# Patient Record
Sex: Male | Born: 1995 | Race: Black or African American | Hispanic: No | Marital: Single | Smoking: Current every day smoker
Health system: Southern US, Community
[De-identification: ages and names within clinical notes are randomized; demographics above are authoritative.]

## PROBLEM LIST (undated history)

## (undated) DIAGNOSIS — S060XAA Concussion with loss of consciousness status unknown, initial encounter: Secondary | ICD-10-CM

## (undated) DIAGNOSIS — S060X9A Concussion with loss of consciousness of unspecified duration, initial encounter: Secondary | ICD-10-CM

---
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
## 2016-08-19 ENCOUNTER — Emergency Department (HOSPITAL_COMMUNITY)
Admission: EM | Admit: 2016-08-19 | Discharge: 2016-08-19 | Disposition: A | Discharge status: Home / Self Care | Payer: No Typology Code available for payment source | Attending: Emergency Medicine | Admitting: Emergency Medicine

## 2016-08-19 ENCOUNTER — Encounter (HOSPITAL_COMMUNITY): Payer: Self-pay

## 2016-08-19 DIAGNOSIS — F17200 Nicotine dependence, unspecified, uncomplicated: Secondary | ICD-10-CM | POA: Diagnosis not present

## 2016-08-19 DIAGNOSIS — Y999 Unspecified external cause status: Secondary | ICD-10-CM | POA: Insufficient documentation

## 2016-08-19 DIAGNOSIS — Y92410 Unspecified street and highway as the place of occurrence of the external cause: Secondary | ICD-10-CM | POA: Diagnosis not present

## 2016-08-19 DIAGNOSIS — Y939 Activity, unspecified: Secondary | ICD-10-CM | POA: Diagnosis not present

## 2016-08-19 DIAGNOSIS — S060X0A Concussion without loss of consciousness, initial encounter: Secondary | ICD-10-CM | POA: Diagnosis not present

## 2016-08-19 DIAGNOSIS — S0990XA Unspecified injury of head, initial encounter: Secondary | ICD-10-CM | POA: Diagnosis present

## 2016-08-19 MED ORDER — IBUPROFEN 200 MG PO TABS
600.0000 mg | ORAL_TABLET | Freq: Once | ORAL | Status: AC
  Administered 2016-08-19: 600 mg via ORAL
  Filled 2016-08-19: qty 3

## 2016-08-19 MED ORDER — CYCLOBENZAPRINE HCL 10 MG PO TABS: 10.0000 mg | ORAL_TABLET | Freq: Every evening | ORAL | 0 refills | Status: DC | PRN

## 2016-08-19 MED ORDER — MELOXICAM 15 MG PO TABS: 15.0000 mg | ORAL_TABLET | Freq: Every day | ORAL | 0 refills | Status: DC

## 2016-08-19 NOTE — Discharge Instructions (Signed)
You may have symptoms up to 2-3 weeks. Return if symptoms are worsening.
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
## 2017-03-25 ENCOUNTER — Emergency Department (HOSPITAL_COMMUNITY)
Admission: EM | Admit: 2017-03-25 | Discharge: 2017-03-25 | Disposition: A | Discharge status: Home / Self Care | Payer: No Typology Code available for payment source | Attending: Emergency Medicine | Admitting: Emergency Medicine

## 2017-03-25 ENCOUNTER — Encounter (HOSPITAL_COMMUNITY): Payer: Self-pay | Admitting: Emergency Medicine

## 2017-03-25 DIAGNOSIS — S161XXA Strain of muscle, fascia and tendon at neck level, initial encounter: Secondary | ICD-10-CM | POA: Diagnosis not present

## 2017-03-25 DIAGNOSIS — S199XXA Unspecified injury of neck, initial encounter: Secondary | ICD-10-CM | POA: Diagnosis present

## 2017-03-25 DIAGNOSIS — Y999 Unspecified external cause status: Secondary | ICD-10-CM | POA: Insufficient documentation

## 2017-03-25 DIAGNOSIS — Y92410 Unspecified street and highway as the place of occurrence of the external cause: Secondary | ICD-10-CM | POA: Diagnosis not present

## 2017-03-25 DIAGNOSIS — F17200 Nicotine dependence, unspecified, uncomplicated: Secondary | ICD-10-CM | POA: Insufficient documentation

## 2017-03-25 DIAGNOSIS — Z79899 Other long term (current) drug therapy: Secondary | ICD-10-CM | POA: Diagnosis not present

## 2017-03-25 DIAGNOSIS — S39012A Strain of muscle, fascia and tendon of lower back, initial encounter: Secondary | ICD-10-CM | POA: Diagnosis not present

## 2017-03-25 DIAGNOSIS — Y939 Activity, unspecified: Secondary | ICD-10-CM | POA: Insufficient documentation

## 2017-03-25 HISTORY — DX: Concussion with loss of consciousness of unspecified duration, initial encounter: S06.0X9A

## 2017-03-25 HISTORY — DX: Concussion with loss of consciousness status unknown, initial encounter: S06.0XAA

## 2017-03-25 MED ORDER — IBUPROFEN 800 MG PO TABS
800.0000 mg | ORAL_TABLET | Freq: Once | ORAL | Status: AC
  Administered 2017-03-25: 800 mg via ORAL
  Filled 2017-03-25: qty 1

## 2017-03-25 MED ORDER — CYCLOBENZAPRINE HCL 10 MG PO TABS: 10.0000 mg | ORAL_TABLET | Freq: Two times a day (BID) | ORAL | 0 refills | Status: DC | PRN

## 2017-03-25 MED ORDER — IBUPROFEN 800 MG PO TABS: 800.0000 mg | ORAL_TABLET | Freq: Three times a day (TID) | ORAL | 0 refills | Status: DC | PRN
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
## 2019-03-30 ENCOUNTER — Encounter (HOSPITAL_COMMUNITY): Payer: Self-pay | Admitting: Emergency Medicine

## 2019-03-30 ENCOUNTER — Emergency Department (HOSPITAL_COMMUNITY)
Admission: EM | Admit: 2019-03-30 | Discharge: 2019-03-30 | Disposition: A | Discharge status: Home / Self Care | Payer: Self-pay | Attending: Emergency Medicine | Admitting: Emergency Medicine

## 2019-03-30 ENCOUNTER — Emergency Department (HOSPITAL_COMMUNITY): Payer: Self-pay

## 2019-03-30 DIAGNOSIS — E860 Dehydration: Secondary | ICD-10-CM | POA: Insufficient documentation

## 2019-03-30 DIAGNOSIS — F17200 Nicotine dependence, unspecified, uncomplicated: Secondary | ICD-10-CM | POA: Insufficient documentation

## 2019-03-30 DIAGNOSIS — Y9289 Other specified places as the place of occurrence of the external cause: Secondary | ICD-10-CM | POA: Insufficient documentation

## 2019-03-30 DIAGNOSIS — Y999 Unspecified external cause status: Secondary | ICD-10-CM | POA: Insufficient documentation

## 2019-03-30 DIAGNOSIS — Y9389 Activity, other specified: Secondary | ICD-10-CM | POA: Insufficient documentation

## 2019-03-30 DIAGNOSIS — R51 Headache: Secondary | ICD-10-CM | POA: Insufficient documentation

## 2019-03-30 DIAGNOSIS — W19XXXA Unspecified fall, initial encounter: Secondary | ICD-10-CM | POA: Insufficient documentation

## 2019-03-30 DIAGNOSIS — R569 Unspecified convulsions: Secondary | ICD-10-CM | POA: Insufficient documentation

## 2019-03-30 DIAGNOSIS — S80212A Abrasion, left knee, initial encounter: Secondary | ICD-10-CM | POA: Insufficient documentation

## 2019-03-30 LAB — CBC WITH DIFFERENTIAL/PLATELET
Abs Immature Granulocytes: 0.05 10*3/uL (ref 0.00–0.07)
Basophils Absolute: 0.1 10*3/uL (ref 0.0–0.1)
Basophils Relative: 1 %
Eosinophils Absolute: 0.2 10*3/uL (ref 0.0–0.5)
Eosinophils Relative: 2 %
HCT: 38.8 % — ABNORMAL LOW (ref 39.0–52.0)
Hemoglobin: 12 g/dL — ABNORMAL LOW (ref 13.0–17.0)
Immature Granulocytes: 1 %
Lymphocytes Relative: 42 %
Lymphs Abs: 3.8 10*3/uL (ref 0.7–4.0)
MCH: 25.7 pg — ABNORMAL LOW (ref 26.0–34.0)
MCHC: 30.9 g/dL (ref 30.0–36.0)
MCV: 83.1 fL (ref 80.0–100.0)
Monocytes Absolute: 0.8 10*3/uL (ref 0.1–1.0)
Monocytes Relative: 8 %
Neutro Abs: 4.2 10*3/uL (ref 1.7–7.7)
Neutrophils Relative %: 46 %
Platelets: 277 10*3/uL (ref 150–400)
RBC: 4.67 MIL/uL (ref 4.22–5.81)
RDW: 15.2 % (ref 11.5–15.5)
WBC: 9 10*3/uL (ref 4.0–10.5)
nRBC: 0 % (ref 0.0–0.2)

## 2019-03-30 LAB — COMPREHENSIVE METABOLIC PANEL
ALT: 22 U/L (ref 0–44)
AST: 42 U/L — ABNORMAL HIGH (ref 15–41)
Albumin: 4.1 g/dL (ref 3.5–5.0)
Alkaline Phosphatase: 50 U/L (ref 38–126)
Anion gap: 19 — ABNORMAL HIGH (ref 5–15)
BUN: 13 mg/dL (ref 6–20)
CO2: 18 mmol/L — ABNORMAL LOW (ref 22–32)
Calcium: 9 mg/dL (ref 8.9–10.3)
Chloride: 100 mmol/L (ref 98–111)
Creatinine, Ser: 1.39 mg/dL — ABNORMAL HIGH (ref 0.61–1.24)
GFR calc Af Amer: >60 mL/min (ref >60)
GFR calc non Af Amer: >60 mL/min (ref >60)
Glucose, Bld: 83 mg/dL (ref 70–99)
Potassium: 4.2 mmol/L (ref 3.5–5.1)
Sodium: 137 mmol/L (ref 135–145)
Total Bilirubin: 1.1 mg/dL (ref 0.3–1.2)
Total Protein: 7.3 g/dL (ref 6.5–8.1)

## 2019-03-30 MED ORDER — LACTATED RINGERS IV BOLUS
1000.0000 mL | Freq: Once | INTRAVENOUS | Status: AC
  Administered 2019-03-30: 1000 mL via INTRAVENOUS
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
## 2019-03-30 NOTE — ED Notes (Signed)
Pt ambulated in hallway with no difficulties, denies any dizziness

## 2019-03-30 NOTE — ED Provider Notes (Signed)
Emergency Department Provider Note   I have reviewed the triage vital signs and the nursing notes.   HISTORY  Chief Complaint Seizures   HPI Dakota [name] is a 23 y.o. male without any known past medical history who presents to the emergency department today with seizure-like activity.  History is obtained from the patient and from his brother who witnessed it.  Patient states that over the last couple days have been celebrate multiple birthdays is a decreased sleep decreased p.o. intake but has been increased alcohol intake.  Patient states that last night he remember was standing at the bar and then he remembers when paramedics were bringing him to the hospital and he has a slight headache at this time.  His brother states that everything was normal and they were just standing there and then the patient collapsed and had onset of full body shaking.  Did not notice any foaming the mouth.  Patient states that his tongue hurts and his left knee has an abrasion but no other pain.  Brother states that the shaking lasted approximate 4 to 5 minutes.  Noticed no incontinence.  Does not think that he hit his head on the way down.  Patient had some confusion afterwards that slowly improved and now patient feels like he is at baseline.   No other associated or modifying symptoms.    Past Medical History:  Diagnosis Date  . Concussion     Patient Active Problem List   Diagnosis Date Noted  . Altered mental status 01/02/2013    History reviewed. No pertinent surgical history.  Current Outpatient Rx  . Order #: [id number] Class: Print  . Order #: [id number] Class: Print    Allergies Patient has no known allergies.  Family History  Problem Relation Age of Onset  . Heart disease Maternal Grandmother   . Cancer Maternal Grandmother   . Kidney disease Maternal Grandfather   . Cancer Paternal Grandmother     Social History Social History   Tobacco Use  . Smoking status: Current Some  Day Smoker  . Smokeless tobacco: Never Used  Substance Use Topics  . Alcohol use: No  . Drug use: No    Review of Systems  All other systems negative except as documented in the HPI. All pertinent positives and negatives as reviewed in the HPI. ____________________________________________   PHYSICAL EXAM:  VITAL SIGNS: ED Triage Vitals  Enc Vitals Group     BP 03/30/19 0123 122/81     Pulse Rate 03/30/19 0123 73     Resp 03/30/19 0123 16     Temp 03/30/19 0123 (!) 97.5 F (36.4 C)     Temp Source 03/30/19 0123 Oral     SpO2 03/30/19 0123 100 %     Weight 03/30/19 0113 165 lb (74.8 kg)     Height 03/30/19 0113 6' (1.829 m)    Constitutional: Alert and oriented. Well appearing and in no acute distress. Eyes: Conjunctivae are normal. PERRL. EOMI. Mild strabismus Head: Atraumatic. Nose: No congestion/rhinnorhea. Mouth/Throat: Mucous membranes are moist.  Oropharynx non-erythematous.  Has bite mark on right lateral tongue Neck: No stridor.  No meningeal signs.   Cardiovascular: Normal rate, regular rhythm. Good peripheral circulation. Grossly normal heart sounds.   Respiratory: Normal respiratory effort.  No retractions. Lungs CTAB. Gastrointestinal: Soft and nontender. No distention.  Musculoskeletal: No lower extremity tenderness nor edema. No gross deformities of extremities. Neurologic:  No altered mental status, able to give full seemingly accurate history.  Face  is symmetric, EOM's intact, pupils equal and reactive, vision intact, tongue and uvula midline without deviation. Upper and Lower extremity motor 5/5, intact pain perception in distal extremities, 2+ reflexes in biceps, patella and achilles tendons. Able to perform finger to nose normal with both hands. Walks without assistance or evident ataxia.  Skin:  Skin is warm, dry does have an abrasion over his left knee is hemostatic without laceration.. No rash noted.  ____________________________________________    LABS (all labs ordered are listed, but only abnormal results are displayed)  Labs Reviewed  CBC WITH DIFFERENTIAL/PLATELET - Abnormal; Notable for the following components:      Result Value   Hemoglobin 12.0 (*)    HCT 38.8 (*)    MCH 25.7 (*)    All other components within normal limits  COMPREHENSIVE METABOLIC PANEL - Abnormal; Notable for the following components:   CO2 18 (*)    Creatinine, Ser 1.39 (*)    AST 42 (*)    Anion gap 19 (*)    All other components within normal limits   ____________________________________________  EKG   EKG Interpretation  Date/Time:  Thursday March 30 2019 01:22:15 EDT Ventricular Rate:  72 PR Interval:    QRS Duration: 126 QT Interval:  427 QTC Calculation: 468 R Axis:   54 Text Interpretation:  Sinus rhythm Nonspecific intraventricular conduction delay ST elev, probable normal early repol pattern given changes with age, no acute abnormalities compared to 2014. Confirmed by Marily MemosMesner, Destynie Toomey [id number]) on 03/30/2019 1:38:33 AM       ____________________________________________  RADIOLOGY  Ct Head Wo Contrast  Result Date: 03/30/2019 CLINICAL DATA:  Seizure, head trauma EXAM: CT HEAD WITHOUT CONTRAST TECHNIQUE: Contiguous axial images were obtained from the base of the skull through the vertex without intravenous contrast. COMPARISON:  01/01/2013 FINDINGS: Brain: No acute intracranial abnormality. Specifically, no hemorrhage, hydrocephalus, mass lesion, acute infarction, or significant intracranial injury. Vascular: No hyperdense vessel or unexpected calcification. Skull: No acute calvarial abnormality. Sinuses/Orbits: Visualized paranasal sinuses and mastoids clear. Orbital soft tissues unremarkable. Other: None IMPRESSION: Normal study. Electronically Signed   By: Charlett NoseKevin  Dover M.D.   On: 03/30/2019 02:46    ____________________________________________   PROCEDURES  Procedure(s) performed:   Procedures    ____________________________________________   INITIAL IMPRESSION / ASSESSMENT AND PLAN / ED COURSE    New seizure probably related to decreased sleep and decreased intake and increased alcohol intake.  I doubt that it would be a syncope with 4 to 5 minutes of full body shaking and postictal state with tongue biting but is obviously consideration.  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  Is very mild in nature but will check a head CT just to ensure there is no intracranial abnormalities there.  Secondary to binge drinking and decreased intake of last couple days we will check labs to make sure his sodium is fine.  EKG looks okay.  He is at his baseline so will likely be discharged if everything comes back normal. No indication for anti-epileptics currently   Pertinent labs & imaging results that were available during my care of the patient were reviewed by me and considered in my medical decision making (see chart for details).  A medical screening exam was performed and I feel the patient has had an appropriate workup for their chief complaint at this time and likelihood of emergent condition existing is low. They have been counseled on decision, discharge,  follow up and which symptoms necessitate immediate return to the emergency department. They or their family verbally stated understanding and agreement with plan and discharged in stable condition.   ____________________________________________  FINAL CLINICAL IMPRESSION(S) / ED DIAGNOSES  Final diagnoses:  Seizure-like activity (HCC)  Dehydration     MEDICATIONS GIVEN DURING THIS VISIT:  Medications  lactated ringers bolus 1,000 mL (0 mLs Intravenous Stopped 03/30/19 0406)     NEW OUTPATIENT MEDICATIONS STARTED DURING THIS VISIT:  Discharge Medication List as of 03/30/2019  4:10 AM      Note:  This note was prepared with assistance of Dragon voice recognition software. Occasional  wrong-word or sound-a-like substitutions may have occurred due to the inherent limitations of voice recognition software.   Robben Jagiello, Barbara CowerJason, MD 03/30/19 [id number]

## 2019-03-30 NOTE — ED Notes (Signed)
Patient verbalizes understanding of discharge instructions. Opportunity for questioning and answers were provided. Armband removed by staff, pt discharged from ED. Ambulated out to lobby  

## 2020-05-17 ENCOUNTER — Emergency Department (HOSPITAL_COMMUNITY)
Admission: EM | Admit: 2020-05-17 | Discharge: 2020-05-17 | Disposition: A | Discharge status: Home / Self Care | Payer: Self-pay | Attending: Emergency Medicine | Admitting: Emergency Medicine

## 2020-05-17 ENCOUNTER — Emergency Department (HOSPITAL_COMMUNITY): Payer: Self-pay

## 2020-05-17 ENCOUNTER — Ambulatory Visit
Admission: EM | Admit: 2020-05-17 | Discharge: 2020-05-17 | Disposition: A | Discharge status: Home / Self Care | Payer: Self-pay | Attending: Physician Assistant | Admitting: Physician Assistant

## 2020-05-17 ENCOUNTER — Other Ambulatory Visit: Payer: Self-pay

## 2020-05-17 ENCOUNTER — Encounter (HOSPITAL_COMMUNITY): Payer: Self-pay

## 2020-05-17 DIAGNOSIS — Y939 Activity, unspecified: Secondary | ICD-10-CM | POA: Insufficient documentation

## 2020-05-17 DIAGNOSIS — Z5321 Procedure and treatment not carried out due to patient leaving prior to being seen by health care provider: Secondary | ICD-10-CM | POA: Insufficient documentation

## 2020-05-17 DIAGNOSIS — S62316A Displaced fracture of base of fifth metacarpal bone, right hand, initial encounter for closed fracture: Secondary | ICD-10-CM

## 2020-05-17 DIAGNOSIS — Y999 Unspecified external cause status: Secondary | ICD-10-CM | POA: Insufficient documentation

## 2020-05-17 DIAGNOSIS — S60921A Unspecified superficial injury of right hand, initial encounter: Secondary | ICD-10-CM | POA: Insufficient documentation

## 2020-05-17 DIAGNOSIS — Y929 Unspecified place or not applicable: Secondary | ICD-10-CM | POA: Insufficient documentation

## 2020-05-17 DIAGNOSIS — X58XXXA Exposure to other specified factors, initial encounter: Secondary | ICD-10-CM | POA: Insufficient documentation

## 2020-05-17 MED ORDER — TRAMADOL HCL 50 MG PO TABS: 50.0000 mg | ORAL_TABLET | Freq: Three times a day (TID) | ORAL | 0 refills | Status: DC | PRN

## 2020-05-17 NOTE — ED Triage Notes (Signed)
Patient states he hit his right hand on a wall last night. Patient has pain and swelling to the hand.

## 2020-05-17 NOTE — Discharge Instructions (Signed)
As discussed, your x-ray shows a bone fracture to your hand.  Splint applied today.  Start ibuprofen 800 mg 3 times a day, Tylenol 1000 mg 3 times a day.  If still having pain, can take tramadol as needed for pain.  Ice compress, rest.  Follow-up with hand orthopedic for further evaluation and management needed.

## 2020-05-17 NOTE — ED Triage Notes (Signed)
Pt c/o right hand pain/swelling 2/2 punching a wall last night. Pt had xrays taken but left before being medically evaluated. Right hand edematous, limited ROM, unable to make a fist.  +2 radial pulse, fingers warm to touch, brisk cap refill.
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
## 2020-06-03 ENCOUNTER — Other Ambulatory Visit (HOSPITAL_COMMUNITY): Payer: Self-pay

## 2020-06-03 ENCOUNTER — Encounter (HOSPITAL_BASED_OUTPATIENT_CLINIC_OR_DEPARTMENT_OTHER): Payer: Self-pay | Admitting: Orthopedic Surgery

## 2020-06-03 ENCOUNTER — Other Ambulatory Visit: Payer: Self-pay
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
## 2020-06-04 ENCOUNTER — Other Ambulatory Visit (HOSPITAL_COMMUNITY)
Admission: RE | Admit: 2020-06-04 | Discharge: 2020-06-04 | Disposition: A | Discharge status: Home / Self Care | Payer: 59 | Source: Ambulatory Visit | Attending: Orthopedic Surgery | Admitting: Orthopedic Surgery

## 2020-06-04 DIAGNOSIS — Z01812 Encounter for preprocedural laboratory examination: Secondary | ICD-10-CM | POA: Insufficient documentation

## 2020-06-04 DIAGNOSIS — Z20822 Contact with and (suspected) exposure to covid-19: Secondary | ICD-10-CM | POA: Insufficient documentation

## 2020-06-04 LAB — SARS CORONAVIRUS 2 (TAT 6-24 HRS): SARS Coronavirus 2: NEGATIVE

## 2020-06-05 ENCOUNTER — Ambulatory Visit (HOSPITAL_BASED_OUTPATIENT_CLINIC_OR_DEPARTMENT_OTHER): Payer: 59 | Admitting: Anesthesiology

## 2020-06-05 ENCOUNTER — Encounter (HOSPITAL_BASED_OUTPATIENT_CLINIC_OR_DEPARTMENT_OTHER)
Admission: RE | Disposition: A | Discharge status: Home / Self Care | Payer: Self-pay | Source: Home / Self Care | Attending: Orthopedic Surgery

## 2020-06-05 ENCOUNTER — Ambulatory Visit (HOSPITAL_BASED_OUTPATIENT_CLINIC_OR_DEPARTMENT_OTHER)
Admission: RE | Admit: 2020-06-05 | Discharge: 2020-06-05 | Disposition: A | Discharge status: Home / Self Care | Payer: 59 | Attending: Orthopedic Surgery | Admitting: Orthopedic Surgery

## 2020-06-05 ENCOUNTER — Encounter (HOSPITAL_BASED_OUTPATIENT_CLINIC_OR_DEPARTMENT_OTHER): Payer: Self-pay | Admitting: Orthopedic Surgery

## 2020-06-05 ENCOUNTER — Other Ambulatory Visit: Payer: Self-pay

## 2020-06-05 DIAGNOSIS — F17210 Nicotine dependence, cigarettes, uncomplicated: Secondary | ICD-10-CM | POA: Diagnosis not present

## 2020-06-05 DIAGNOSIS — S62316A Displaced fracture of base of fifth metacarpal bone, right hand, initial encounter for closed fracture: Secondary | ICD-10-CM | POA: Diagnosis not present

## 2020-06-05 DIAGNOSIS — W228XXA Striking against or struck by other objects, initial encounter: Secondary | ICD-10-CM | POA: Insufficient documentation

## 2020-06-05 HISTORY — PX: CLOSED REDUCTION METACARPAL WITH PERCUTANEOUS PINNING: SHX5613

## 2020-06-05 SURGERY — CLOSED REDUCTION, FRACTURE, METACARPAL BONE, WITH PERCUTANEOUS PINNING
Anesthesia: Monitor Anesthesia Care | Site: Finger | Laterality: Right

## 2020-06-05 MED ORDER — CEFAZOLIN SODIUM-DEXTROSE 2-4 GM/100ML-% IV SOLN
2.0000 g | INTRAVENOUS | Status: AC
  Administered 2020-06-05: 2 g via INTRAVENOUS

## 2020-06-05 MED ORDER — CEFAZOLIN SODIUM-DEXTROSE 2-4 GM/100ML-% IV SOLN
INTRAVENOUS | Status: AC
  Filled 2020-06-05: qty 100

## 2020-06-05 MED ORDER — BUPIVACAINE-EPINEPHRINE (PF) 0.5% -1:200000 IJ SOLN
INTRAMUSCULAR | Status: DC | PRN
  Administered 2020-06-05: 15 mL via PERINEURAL

## 2020-06-05 MED ORDER — FENTANYL CITRATE (PF) 100 MCG/2ML IJ SOLN
100.0000 ug | Freq: Once | INTRAMUSCULAR | Status: AC
  Administered 2020-06-05: 100 ug via INTRAVENOUS

## 2020-06-05 MED ORDER — LIDOCAINE HCL (CARDIAC) PF 100 MG/5ML IV SOSY
PREFILLED_SYRINGE | INTRAVENOUS | Status: DC | PRN
  Administered 2020-06-05: 30 mg via INTRAVENOUS

## 2020-06-05 MED ORDER — BUPIVACAINE LIPOSOME 1.3 % IJ SUSP
INTRAMUSCULAR | Status: DC | PRN
  Administered 2020-06-05: 10 mL via PERINEURAL

## 2020-06-05 MED ORDER — MIDAZOLAM HCL 2 MG/2ML IJ SOLN
INTRAMUSCULAR | Status: AC
  Filled 2020-06-05: qty 2

## 2020-06-05 MED ORDER — FENTANYL CITRATE (PF) 100 MCG/2ML IJ SOLN
INTRAMUSCULAR | Status: AC
  Filled 2020-06-05: qty 2

## 2020-06-05 MED ORDER — PROPOFOL 500 MG/50ML IV EMUL
INTRAVENOUS | Status: DC | PRN
  Administered 2020-06-05: 120 ug/kg/min via INTRAVENOUS

## 2020-06-05 MED ORDER — ONDANSETRON HCL 4 MG/2ML IJ SOLN
INTRAMUSCULAR | Status: DC | PRN
  Administered 2020-06-05: 4 mg via INTRAVENOUS

## 2020-06-05 MED ORDER — LACTATED RINGERS IV SOLN: INTRAVENOUS | Status: DC

## 2020-06-05 MED ORDER — DEXMEDETOMIDINE (PRECEDEX) IN NS 20 MCG/5ML (4 MCG/ML) IV SYRINGE
PREFILLED_SYRINGE | INTRAVENOUS | Status: DC | PRN
  Administered 2020-06-05 (×2): 4 ug via INTRAVENOUS

## 2020-06-05 MED ORDER — MIDAZOLAM HCL 2 MG/2ML IJ SOLN
2.0000 mg | Freq: Once | INTRAMUSCULAR | Status: AC
  Administered 2020-06-05: 2 mg via INTRAVENOUS

## 2020-06-05 SURGICAL SUPPLY — 69 items
BLADE SURG 15 STRL LF DISP TIS (BLADE) ×1 IMPLANT
BLADE SURG 15 STRL SS (BLADE) ×2
BNDG CMPR 9X4 STRL LF SNTH (GAUZE/BANDAGES/DRESSINGS)
BNDG COHESIVE 1X5 TAN STRL LF (GAUZE/BANDAGES/DRESSINGS) IMPLANT
BNDG COHESIVE 3X5 TAN STRL LF (GAUZE/BANDAGES/DRESSINGS) IMPLANT
BNDG CONFORM 2 STRL LF (GAUZE/BANDAGES/DRESSINGS) IMPLANT
BNDG CONFORM 3 STRL LF (GAUZE/BANDAGES/DRESSINGS) IMPLANT
BNDG ELASTIC 2X5.8 VLCR STR LF (GAUZE/BANDAGES/DRESSINGS) IMPLANT
BNDG ELASTIC 3X5.8 VLCR STR LF (GAUZE/BANDAGES/DRESSINGS) ×4 IMPLANT
BNDG ELASTIC 4X5.8 VLCR STR LF (GAUZE/BANDAGES/DRESSINGS) IMPLANT
BNDG ESMARK 4X9 LF (GAUZE/BANDAGES/DRESSINGS) IMPLANT
BNDG GAUZE ELAST 4 BULKY (GAUZE/BANDAGES/DRESSINGS) ×2 IMPLANT
CANISTER SUCT 1200ML W/VALVE (MISCELLANEOUS) IMPLANT
CORD BIPOLAR FORCEPS 12FT (ELECTRODE) IMPLANT
COVER BACK TABLE 60X90IN (DRAPES) ×2 IMPLANT
COVER MAYO STAND STRL (DRAPES) ×2 IMPLANT
COVER WAND RF STERILE (DRAPES) IMPLANT
CUFF TOURN SGL QUICK 18X4 (TOURNIQUET CUFF) ×1 IMPLANT
DECANTER SPIKE VIAL GLASS SM (MISCELLANEOUS) IMPLANT
DRAIN TLS ROUND 10FR (DRAIN) IMPLANT
DRAPE EXTREMITY T 121X128X90 (DISPOSABLE) ×2 IMPLANT
DRAPE OEC MINIVIEW 54X84 (DRAPES) ×2 IMPLANT
DRAPE SURG 17X23 STRL (DRAPES) ×2 IMPLANT
DRSG EMULSION OIL 3X3 NADH (GAUZE/BANDAGES/DRESSINGS) ×1 IMPLANT
GAUZE 4X4 16PLY RFD (DISPOSABLE) IMPLANT
GAUZE SPONGE 4X4 12PLY STRL (GAUZE/BANDAGES/DRESSINGS) ×2 IMPLANT
GAUZE XEROFORM 1X8 LF (GAUZE/BANDAGES/DRESSINGS) ×2 IMPLANT
GLOVE BIO SURGEON STRL SZ 6.5 (GLOVE) ×4 IMPLANT
GLOVE BIO SURGEON STRL SZ8 (GLOVE) ×2 IMPLANT
GLOVE BIOGEL PI IND STRL 6.5 (GLOVE) ×1 IMPLANT
GLOVE BIOGEL PI IND STRL 8.5 (GLOVE) ×1 IMPLANT
GLOVE BIOGEL PI INDICATOR 6.5 (GLOVE) ×2
GLOVE BIOGEL PI INDICATOR 8.5 (GLOVE) ×1
GOWN STRL REUS W/ TWL LRG LVL3 (GOWN DISPOSABLE) ×2 IMPLANT
GOWN STRL REUS W/ TWL XL LVL3 (GOWN DISPOSABLE) ×1 IMPLANT
GOWN STRL REUS W/TWL LRG LVL3 (GOWN DISPOSABLE) ×4
GOWN STRL REUS W/TWL XL LVL3 (GOWN DISPOSABLE) ×2
K-WIRE .045 CH (WIRE) ×6
KWIRE .045 CH (WIRE) IMPLANT
NDL HYPO 25X1 1.5 SAFETY (NEEDLE) ×1 IMPLANT
NEEDLE HYPO 22GX1.5 SAFETY (NEEDLE) IMPLANT
NEEDLE HYPO 25X1 1.5 SAFETY (NEEDLE) IMPLANT
NS IRRIG 1000ML POUR BTL (IV SOLUTION) ×2 IMPLANT
PACK BASIN DAY SURGERY FS (CUSTOM PROCEDURE TRAY) ×2 IMPLANT
PAD ALCOHOL SWAB (MISCELLANEOUS) IMPLANT
PAD CAST 3X4 CTTN HI CHSV (CAST SUPPLIES) ×1 IMPLANT
PAD CAST 4YDX4 CTTN HI CHSV (CAST SUPPLIES) IMPLANT
PADDING CAST ABS 3INX4YD NS (CAST SUPPLIES) ×1
PADDING CAST ABS 4INX4YD NS (CAST SUPPLIES) ×1
PADDING CAST ABS COTTON 3X4 (CAST SUPPLIES) ×1 IMPLANT
PADDING CAST ABS COTTON 4X4 ST (CAST SUPPLIES) ×1 IMPLANT
PADDING CAST COTTON 2X4 NS (CAST SUPPLIES) IMPLANT
PADDING CAST COTTON 3X4 STRL (CAST SUPPLIES) ×2
PADDING CAST COTTON 4X4 STRL (CAST SUPPLIES)
SHEET MEDIUM DRAPE 40X70 STRL (DRAPES) ×1 IMPLANT
SPLINT FIBERGLASS 3X35 (CAST SUPPLIES) ×1 IMPLANT
SPLINT FIBERGLASS 4X30 (CAST SUPPLIES) IMPLANT
STOCKINETTE 4X48 STRL (DRAPES) ×2 IMPLANT
STRIP CLOSURE SKIN 1/2X4 (GAUZE/BANDAGES/DRESSINGS) IMPLANT
SUCTION FRAZIER HANDLE 10FR (MISCELLANEOUS)
SUCTION TUBE FRAZIER 10FR DISP (MISCELLANEOUS) IMPLANT
SUT MON AB 4-0 PC3 18 (SUTURE) IMPLANT
SUT PROLENE 4 0 PS 2 18 (SUTURE) ×2 IMPLANT
SYR BULB EAR ULCER 3OZ GRN STR (SYRINGE) IMPLANT
SYR CONTROL 10ML LL (SYRINGE) ×1 IMPLANT
TOWEL GREEN STERILE FF (TOWEL DISPOSABLE) ×4 IMPLANT
TRAY DSU PREP LF (CUSTOM PROCEDURE TRAY) ×2 IMPLANT
TUBE CONNECTING 20X1/4 (TUBING) IMPLANT
UNDERPAD 30X36 HEAVY ABSORB (UNDERPADS AND DIAPERS) ×2 IMPLANT

## 2020-06-05 NOTE — Anesthesia Postprocedure Evaluation (Signed)
Anesthesia Post Note  Patient: Dakota [name]  Procedure(s) Performed: CLOSED REDUCTION METACARPAL WITH PERCUTANEOUS PINNING (Right Finger)     Patient location during evaluation: PACU Anesthesia Type: Regional Level of consciousness: awake and alert Pain management: pain level controlled Vital Signs Assessment: post-procedure vital signs reviewed and stable Respiratory status: spontaneous breathing, nonlabored ventilation and respiratory function stable Cardiovascular status: blood pressure returned to baseline and stable Postop Assessment: no apparent nausea or vomiting Anesthetic complications: no   No complications documented.  Last Vitals:  Vitals:   06/05/20 1507 06/05/20 1546  BP: (!) 151/82 132/86  Pulse: 61 64  Resp: 12 18  Temp:  36.5 C  SpO2: 100% 100%    Last Pain:  Vitals:   06/05/20 1546  TempSrc:   PainSc: 0-No pain                 Lowella Curb

## 2020-06-05 NOTE — Op Note (Signed)
PREOPERATIVE DIAGNOSIS: Right small finger metacarpal base CMC fracture dislocation  POSTOPERATIVE DIAGNOSIS: Same  ATTENDING SURGEON: Dr. Bradly Bienenstock who is scrubbed and present for the entire procedure  ASSISTANT SURGEON: Lambert Mody, PA-C was scrubbed and necessary for reduction pinning splinting in a timely fashion  ANESTHESIA: Regional with IV sedation  OPERATIVE PROCEDURE: Percutaneous skeletal fixation of unstable right fifth CMC fracture dislocation Radiographs three views right hand  IMPLANTS: Three 0.045 K wires  RADIOGRAPHIC INTERPRETATION: AP lateral oblique views of the hand do show the K wire fixation across the [facility] joint and within the small finger to the ring finger metacarpals  SURGICAL INDICATIONS: Patient is a right-hand-dominant gentleman who sustained a closed injury to his right hand. Patient was seen evaluate the office and recommended undergo the above procedure. The risks of surgery include but not limited to bleeding infection damage nearby nerves arteries or tendons loss of motion of the wrist and digits incomplete relief of symptoms and need for further surgical invention. Signed informed consent was obtained the day of surgery.  SURGICAL TECHNIQUE: Patient was palpated via the preoperative holding area marked for marker made in the right hand indicate correct operative site. Patient brought back operating placed supine on the anesthesia table where the regional anesthetic was administered. Patient tolerates well. Well-padded tourniquet placed on the right brachium seal with the appropriate drape. The right upper extremities then prepped and draped normal sterile fashion. A timeout was called the correct site identified procedure then begun. Attention was then turned to the right hand. With the aid of the mini C arm close manipulation was then done. The fracture was mobile. Was able to reduce the fifth [facility] joint and get it in a nice near anatomical position.  Following close manipulation percutaneous skeletal fixation was then carried out with 0.045 K wires. The first K wire was placed across the [facility] joint. The additional two K wires were placed from the small finger metacarpal into the ring finger metacarpal. This kept out the wound. The K wires were then cut and bent left out of the skin. Xeroform bolster dressing was then applied. The patient was placed in a well-padded splint. Patient tolerated the procedure well.  POSTOPERATIVE PLAN: Patient be discharged to home. See him back in the office in 2 weeks for wound check pin check x-rays out of the splint into her back into a cast immobilizing past the MP joints the entire hand. I'll plan to see him back at the 4-week mark take the K wires out begin an outpatient therapy regimen. Radiographs at each visit. Placed the therapy order the first postoperative visit for splint and evaluation once the K wires come out.

## 2020-06-05 NOTE — Anesthesia Procedure Notes (Signed)
Anesthesia Regional Block: Supraclavicular block   Pre-Anesthetic Checklist: ,, timeout performed, Correct Patient, Correct Site, Correct Laterality, Correct Procedure, Correct Position, site marked, Risks and benefits discussed,  Surgical consent,  Pre-op evaluation,  At surgeon's request and post-op pain management  Laterality: Right  Prep: chloraprep       Needles:  Injection technique: Single-shot  Needle Type: Echogenic Stimulator Needle     Needle Length: 5cm  Needle Gauge: 22     Additional Needles:   Procedures:, nerve stimulator,,, ultrasound used (permanent image in chart),,,,   Nerve Stimulator or Paresthesia:  Response: hand, 0.45 mA,   Additional Responses:   Narrative:  Start time: 06/05/2020 1:30 PM End time: 06/05/2020 1:35 PM Injection made incrementally with aspirations every 5 mL.  Performed by: Personally  Anesthesiologist: Heather Roberts, MD  Additional Notes: Functioning IV was confirmed and monitors were applied.  A 40mm 22ga Arrow echogenic stimulator needle was used. Sterile prep and drape,hand hygiene and sterile gloves were used. Ultrasound guidance: relevant anatomy identified, needle position confirmed, local anesthetic spread visualized around nerve(s)., vascular puncture avoided.  Image printed for medical record. Negative aspiration and negative test dose prior to incremental administration of local anesthetic. The patient tolerated the procedure well.

## 2020-06-05 NOTE — Progress Notes (Signed)
Assisted Dr. Oddono with right, ultrasound guided block. Side rails up, monitors on throughout procedure. See vital signs in flow sheet. Tolerated Procedure well. 

## 2020-06-05 NOTE — Anesthesia Procedure Notes (Signed)
Procedure Name: MAC Date/Time: 06/05/2020 2:27 PM Performed by: Signe Colt, CRNA Pre-anesthesia Checklist: Patient identified, Emergency Drugs available, Suction available, Patient being monitored and Timeout performed Patient Re-evaluated:Patient Re-evaluated prior to induction Oxygen Delivery Method: Simple face mask

## 2020-06-05 NOTE — Discharge Instructions (Signed)
KEEP BANDAGE CLEAN AND DRY CALL OFFICE FOR F/U APPT [phone] KEEP HAND ELEVATED ABOVE HEART OK TO APPLY ICE TO OPERATIVE AREA CONTACT OFFICE IF ANY WORSENING PAIN OR CONCERNS.    Post Anesthesia Home Care Instructions  Activity: Get plenty of rest for the remainder of the day. A responsible individual must stay with you for 24 hours following the procedure.  For the next 24 hours, DO NOT: -Drive a car -Advertising copywriter -Drink alcoholic beverages -Take any medication unless instructed by your physician -Make any legal decisions or sign important papers.  Meals: Start with liquid foods such as gelatin or soup. Progress to regular foods as tolerated. Avoid greasy, spicy, heavy foods. If nausea and/or vomiting occur, drink only clear liquids until the nausea and/or vomiting subsides. Call your physician if vomiting continues.  Special Instructions/Symptoms: Your throat may feel dry or sore from the anesthesia or the breathing tube placed in your throat during surgery. If this causes discomfort, gargle with warm salt water. The discomfort should disappear within 24 hours.  If you had a scopolamine patch placed behind your ear for the management of post- operative nausea and/or vomiting:  1. The medication in the patch is effective for 72 hours, after which it should be removed.  Wrap patch in a tissue and discard in the trash. Wash hands thoroughly with soap and water. 2. You may remove the patch earlier than 72 hours if you experience unpleasant side effects which may include dry mouth, dizziness or visual disturbances. 3. Avoid touching the patch. Wash your hands with soap and water after contact with the patch.    Regional Anesthesia Blocks  1. Numbness or the inability to move the "blocked" extremity may last from 3-48 hours after placement. The length of time depends on the medication injected and your individual response to the medication. If the numbness is not going away after 48  hours, call your surgeon.  2. The extremity that is blocked will need to be protected until the numbness is gone and the  Strength has returned. Because you cannot feel it, you will need to take extra care to avoid injury. Because it may be weak, you may have difficulty moving it or using it. You may not know what position it is in without looking at it while the block is in effect.  3. For blocks in the legs and feet, returning to weight bearing and walking needs to be done carefully. You will need to wait until the numbness is entirely gone and the strength has returned. You should be able to move your leg and foot normally before you try and bear weight or walk. You will need someone to be with you when you first try to ensure you do not fall and possibly risk injury.  4. Bruising and tenderness at the needle site are common side effects and will resolve in a few days.  5. Persistent numbness or new problems with movement should be communicated to the surgeon or the [facility] [phone] [facility] [phone]).Information for Discharge Teaching: EXPAREL (bupivacaine liposome injectable suspension)   Your surgeon or anesthesiologist gave you EXPAREL(bupivacaine) to help control your pain after surgery.   EXPAREL is a local anesthetic that provides pain relief by numbing the tissue around the surgical site.  EXPAREL is designed to release pain medication over time and can control pain for up to 72 hours.  Depending on how you respond to EXPAREL, you may require less pain  medication during your recovery.  Possible side effects:  Temporary loss of sensation or ability to move in the area where bupivacaine was injected.  Nausea, vomiting, constipation  Rarely, numbness and tingling in your mouth or lips, lightheadedness, or anxiety may occur.  Call your doctor right away if you think you may be experiencing any of these sensations, or if you have other  questions regarding possible side effects.  Follow all other discharge instructions given to you by your surgeon or nurse. Eat a healthy diet and drink plenty of water or other fluids.  If you return to the hospital for any reason within 96 hours following the administration of EXPAREL, it is important for health care providers to know that you have received this anesthetic. A teal colored band has been placed on your arm with the date, time and amount of EXPAREL you have received in order to alert and inform your health care providers. Please leave this armband in place for the full 96 hours following administration, and then you may remove the band.

## 2020-06-05 NOTE — Transfer of Care (Signed)
Immediate Anesthesia Transfer of Care Note  Patient: Dakota [name]  Procedure(s) Performed: CLOSED REDUCTION METACARPAL WITH PERCUTANEOUS PINNING verses open reduction and internal fixation (Right Finger)  Patient Location: PACU  Anesthesia Type:MAC combined with regional for post-op pain  Level of Consciousness: awake, alert , oriented and patient cooperative  Airway & Oxygen Therapy: Patient Spontanous Breathing and Patient connected to face mask oxygen  Post-op Assessment: Report given to RN and Post -op Vital signs reviewed and stable  Post vital signs: Reviewed and stable  Last Vitals:  Vitals Value Taken Time  BP 108/88 06/05/20 1441  Temp 36.4 C 06/05/20 1441  Pulse 59 06/05/20 1444  Resp 13 06/05/20 1444  SpO2 100 % 06/05/20 1444  Vitals shown include unvalidated device data.  Last Pain:  Vitals:   06/05/20 1310  TempSrc:   PainSc: 0-No pain         Complications: No complications documented.

## 2020-06-05 NOTE — Anesthesia Preprocedure Evaluation (Addendum)
Anesthesia Evaluation  Patient identified by MRN, date of birth, ID band Patient awake    Reviewed: Allergy & Precautions, H&P , NPO status , Patient's Chart, lab work & pertinent test results, reviewed documented beta blocker date and time   Airway Mallampati: II  TM Distance: >3 FB Neck ROM: full    Dental no notable dental hx.    Pulmonary neg pulmonary ROS, Current Smoker,    Pulmonary exam normal breath sounds clear to auscultation       Cardiovascular Exercise Tolerance: Good negative cardio ROS   Rhythm:regular Rate:Normal     Neuro/Psych negative neurological ROS  negative psych ROS   GI/Hepatic negative GI ROS, Neg liver ROS,   Endo/Other  negative endocrine ROS  Renal/GU negative Renal ROS  negative genitourinary   Musculoskeletal   Abdominal   Peds  Hematology negative hematology ROS (+)   Anesthesia Other Findings   Reproductive/Obstetrics negative OB ROS                             Anesthesia Physical Anesthesia Plan  ASA: I  Anesthesia Plan: MAC and Regional   Post-op Pain Management:  Regional for Post-op pain   Induction:   PONV Risk Score and Plan: 1 and Ondansetron  Airway Management Planned: Nasal Cannula and Simple Face Mask  Additional Equipment:   Intra-op Plan:   Post-operative Plan:   Informed Consent: I have reviewed the patients History and Physical, chart, labs and discussed the procedure including the risks, benefits and alternatives for the proposed anesthesia with the patient or authorized representative who has indicated his/her understanding and acceptance.     Dental Advisory Given  Plan Discussed with: CRNA and Anesthesiologist  Anesthesia Plan Comments:        Anesthesia Quick Evaluation

## 2020-06-06 ENCOUNTER — Encounter (HOSPITAL_BASED_OUTPATIENT_CLINIC_OR_DEPARTMENT_OTHER): Payer: Self-pay | Admitting: Orthopedic Surgery

## 2021-03-23 IMAGING — CT CT HEAD WITHOUT CONTRAST
4 series · 17 of 47 positions shown, 19 images · non-contrast
Comparison: 01/01/2013

CLINICAL DATA: Seizure, head trauma

EXAM:
CT HEAD WITHOUT CONTRAST
TECHNIQUE: Contiguous axial images were obtained from the base of the skull
through the vertex without intravenous contrast.

[Series 3: head without · axial · non-contrast · 0.44mm/px · z∈[+1328,+1454]mm · 7 of 35 slices shown, 9 images]
[im 5/35  brain]
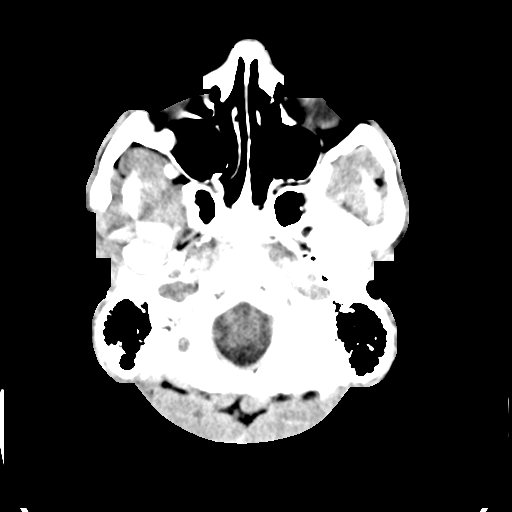
[im 5/35  bone]
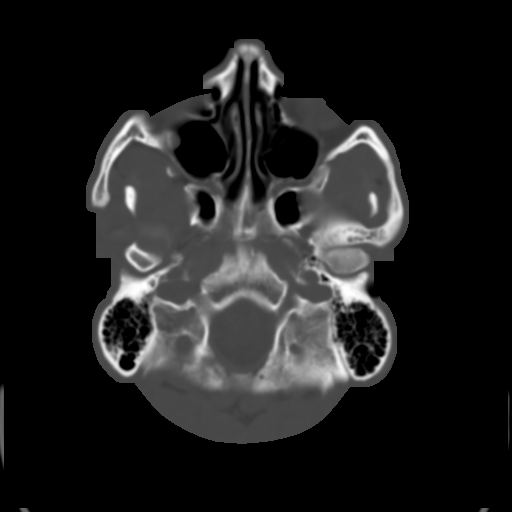
[im 9/35  brain]
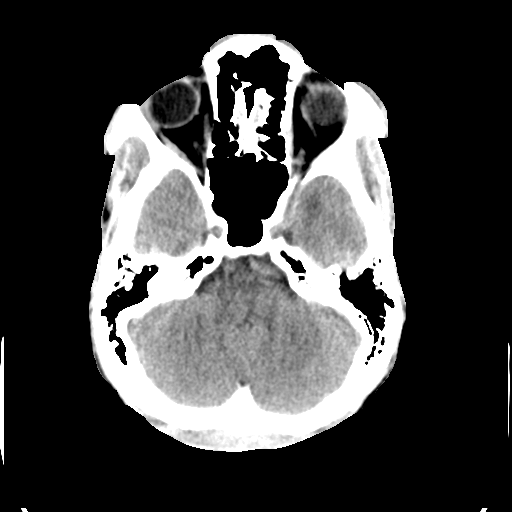
[im 13/35  brain]
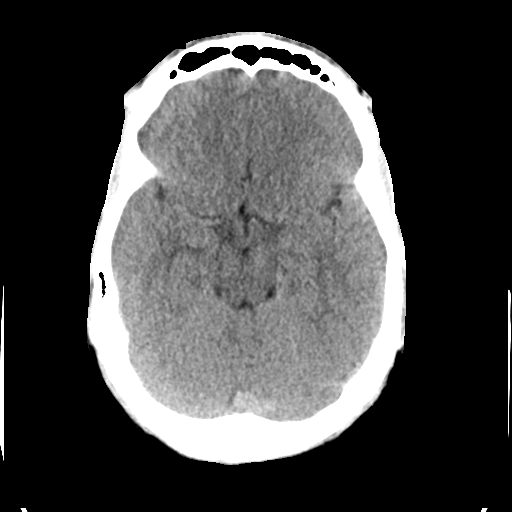
[im 18/35  brain]
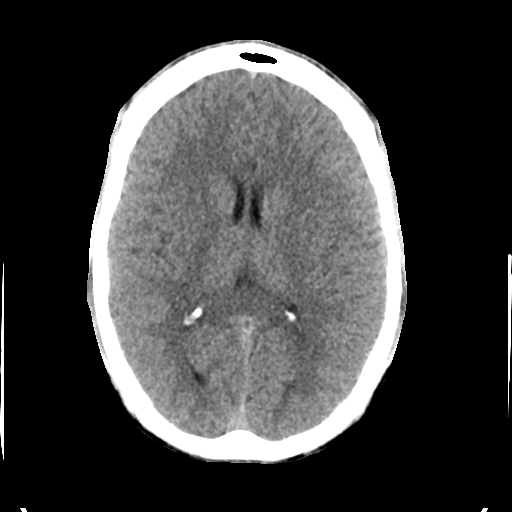
[im 22/35  brain]
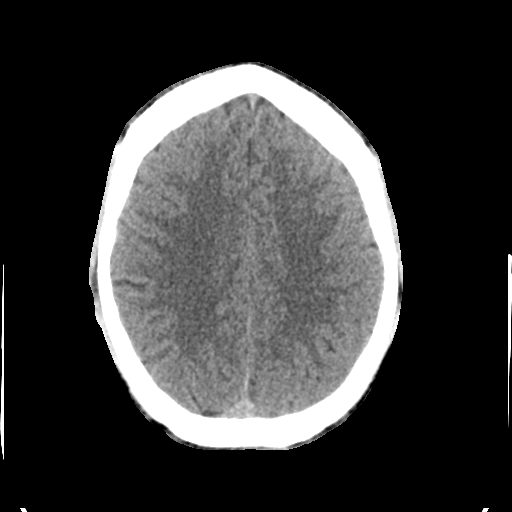
[im 22/35  bone]
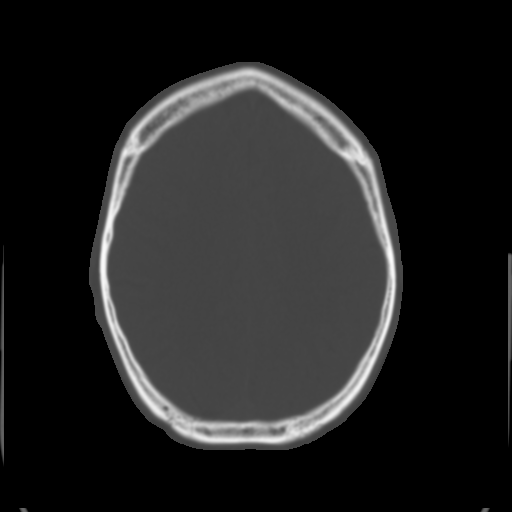
[im 26/35  brain]
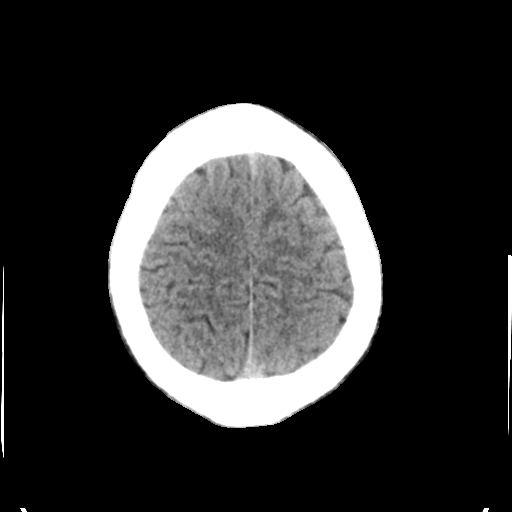
[im 30/35  brain]
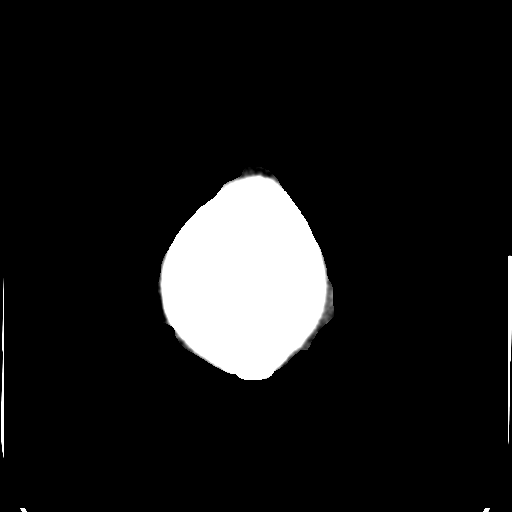

[Series 4: head bone · axial · 0.44mm/px · z∈[+1324,+1384]mm · 4 of 87 slices shown]
[im 9/87  bone]
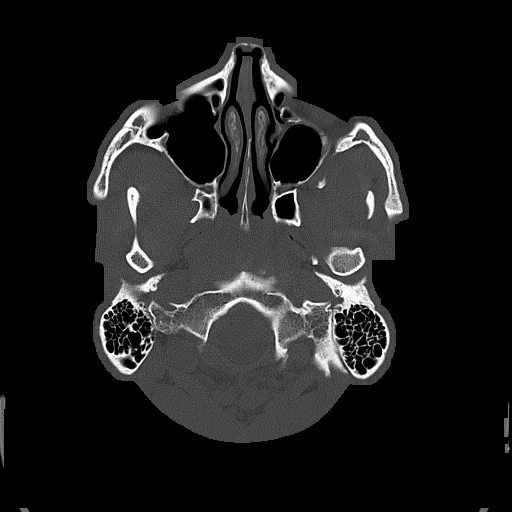
[im 18/87  bone]
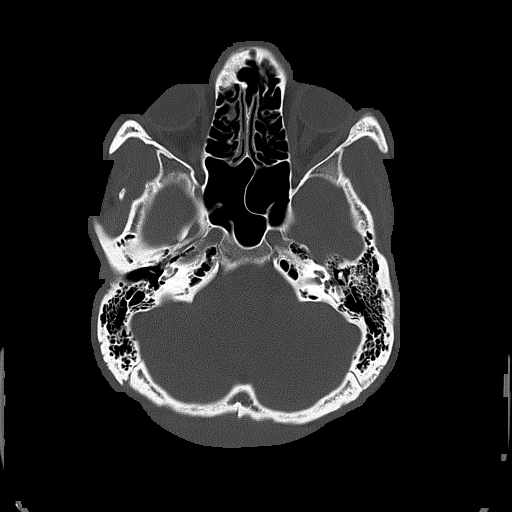
[im 26/87  bone]
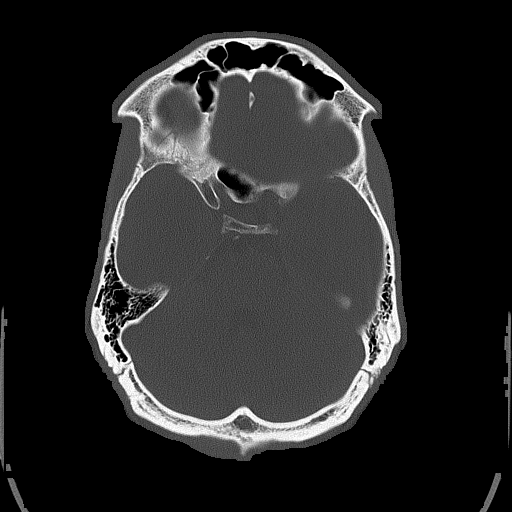
[im 39/87  bone]
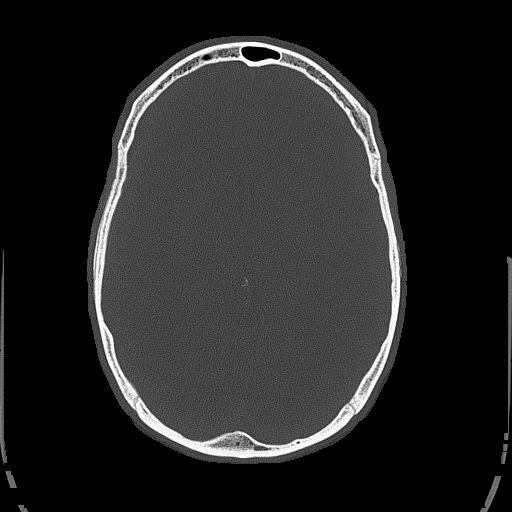

[Series 5: head without cor · coronal · non-contrast · 0.35mm/px · 3 of 72 slices shown]
[im 24/72  brain]
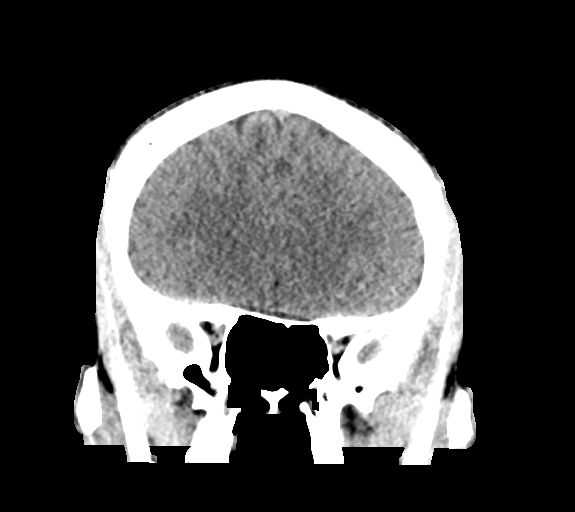
[im 32/72  brain]
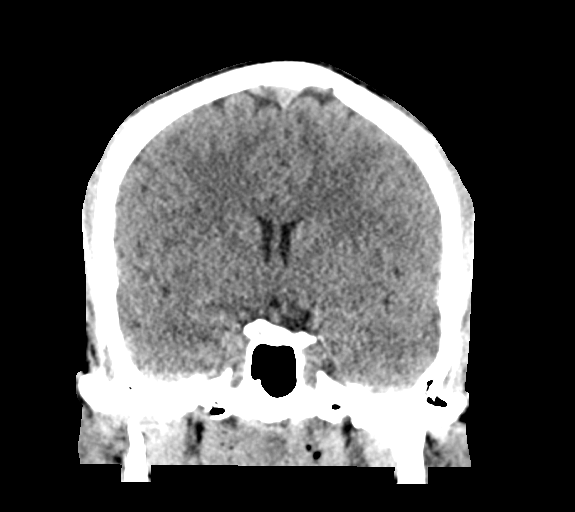
[im 40/72  brain]
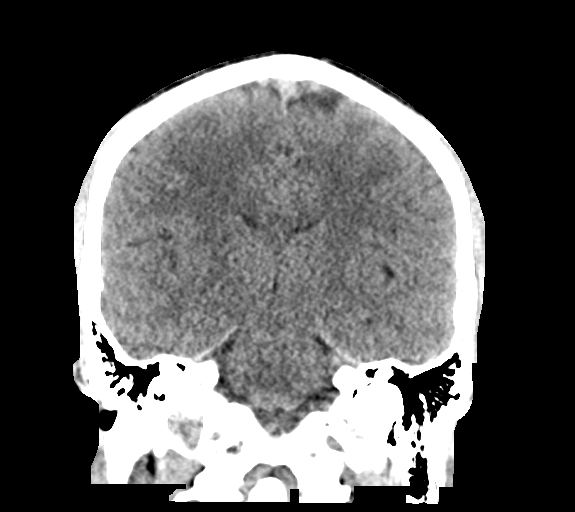

[Series 6: head without sag · sagittal · non-contrast · 0.35mm/px · 3 of 67 slices shown]
[im 23/67  brain]
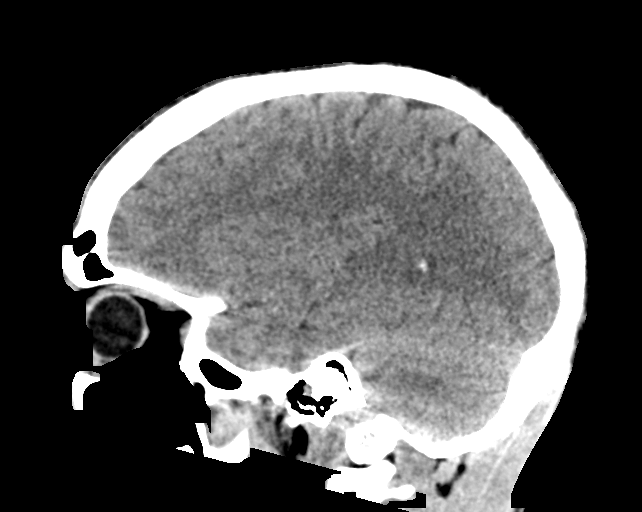
[im 34/67  brain]
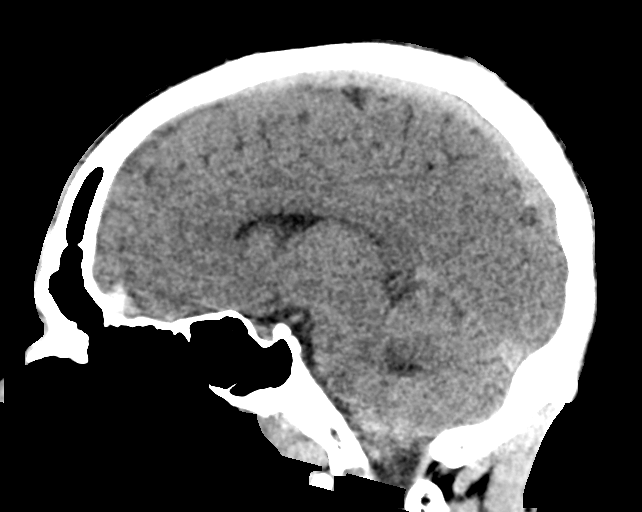
[im 45/67  brain]
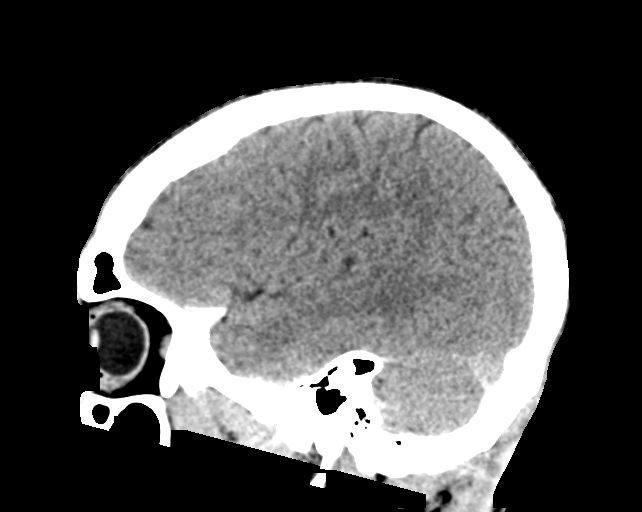

[17 of 47 positions shown; findings below may reference images not displayed]

FINDINGS: Brain: No acute intracranial abnormality. Specifically, no
hemorrhage, hydrocephalus, mass lesion, acute infarction, or
significant intracranial injury.

Vascular: No hyperdense vessel or unexpected calcification.

Skull: No acute calvarial abnormality.

Sinuses/Orbits: Visualized paranasal sinuses and mastoids clear.
Orbital soft tissues unremarkable.

Other: None
IMPRESSION: Normal study.

## 2021-05-15 ENCOUNTER — Encounter (HOSPITAL_COMMUNITY): Payer: Self-pay

## 2021-05-15 ENCOUNTER — Emergency Department (HOSPITAL_COMMUNITY)
Admission: EM | Admit: 2021-05-15 | Discharge: 2021-05-15 | Disposition: A | Discharge status: Home / Self Care | Payer: 59 | Attending: Emergency Medicine | Admitting: Emergency Medicine

## 2021-05-15 DIAGNOSIS — M542 Cervicalgia: Secondary | ICD-10-CM | POA: Insufficient documentation

## 2021-05-15 DIAGNOSIS — M549 Dorsalgia, unspecified: Secondary | ICD-10-CM | POA: Diagnosis not present

## 2021-05-15 DIAGNOSIS — R079 Chest pain, unspecified: Secondary | ICD-10-CM | POA: Insufficient documentation

## 2021-05-15 DIAGNOSIS — Z79899 Other long term (current) drug therapy: Secondary | ICD-10-CM | POA: Insufficient documentation

## 2021-05-15 DIAGNOSIS — Z5321 Procedure and treatment not carried out due to patient leaving prior to being seen by health care provider: Secondary | ICD-10-CM | POA: Diagnosis not present

## 2021-05-15 DIAGNOSIS — R0602 Shortness of breath: Secondary | ICD-10-CM | POA: Diagnosis not present

## 2021-05-15 LAB — CBC WITH DIFFERENTIAL/PLATELET
Abs Immature Granulocytes: 0.01 10*3/uL (ref 0.00–0.07)
Basophils Absolute: 0.1 10*3/uL (ref 0.0–0.1)
Basophils Relative: 1 %
Eosinophils Absolute: 0.2 10*3/uL (ref 0.0–0.5)
Eosinophils Relative: 3 %
HCT: 37.3 % — ABNORMAL LOW (ref 39.0–52.0)
Hemoglobin: 12.2 g/dL — ABNORMAL LOW (ref 13.0–17.0)
Immature Granulocytes: 0 %
Lymphocytes Relative: 54 %
Lymphs Abs: 3.8 10*3/uL (ref 0.7–4.0)
MCH: 26.5 pg (ref 26.0–34.0)
MCHC: 32.7 g/dL (ref 30.0–36.0)
MCV: 80.9 fL (ref 80.0–100.0)
Monocytes Absolute: 0.5 10*3/uL (ref 0.1–1.0)
Monocytes Relative: 7 %
Neutro Abs: 2.5 10*3/uL (ref 1.7–7.7)
Neutrophils Relative %: 35 %
Platelets: 312 10*3/uL (ref 150–400)
RBC: 4.61 MIL/uL (ref 4.22–5.81)
RDW: 14.9 % (ref 11.5–15.5)
WBC: 7.1 10*3/uL (ref 4.0–10.5)
nRBC: 0 % (ref 0.0–0.2)

## 2021-05-15 LAB — COMPREHENSIVE METABOLIC PANEL
ALT: 25 U/L (ref 0–44)
AST: 33 U/L (ref 15–41)
Albumin: 3.8 g/dL (ref 3.5–5.0)
Alkaline Phosphatase: 49 U/L (ref 38–126)
Anion gap: 9 (ref 5–15)
BUN: 14 mg/dL (ref 6–20)
CO2: 20 mmol/L — ABNORMAL LOW (ref 22–32)
Calcium: 9.6 mg/dL (ref 8.9–10.3)
Chloride: 107 mmol/L (ref 98–111)
Creatinine, Ser: 1.12 mg/dL (ref 0.61–1.24)
GFR, Estimated: >60 mL/min (ref >60)
Glucose, Bld: 95 mg/dL (ref 70–99)
Potassium: 3.7 mmol/L (ref 3.5–5.1)
Sodium: 136 mmol/L (ref 135–145)
Total Bilirubin: 0.7 mg/dL (ref 0.3–1.2)
Total Protein: 6.8 g/dL (ref 6.5–8.1)

## 2021-05-15 LAB — RAPID URINE DRUG SCREEN, HOSP PERFORMED
Amphetamines: NOT DETECTED
Barbiturates: NOT DETECTED
Benzodiazepines: NOT DETECTED
Cocaine: NOT DETECTED
Opiates: NOT DETECTED
Tetrahydrocannabinol: POSITIVE — AB

## 2021-05-15 LAB — TROPONIN I (HIGH SENSITIVITY): Troponin I (High Sensitivity): 4 ng/L (ref <18)

## 2021-05-15 NOTE — ED Notes (Signed)
Patient states he is leaving d/t wait time 

## 2021-05-15 NOTE — ED Triage Notes (Signed)
Pt reports that he uses ecstasy every day and has not had any today therefore he is having CP and some SOB with anxiety

## 2021-05-15 NOTE — ED Provider Notes (Signed)
Emergency Medicine Provider Triage Evaluation Note  Dakota [name] , a 25 y.o. male  was evaluated in triage.  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  However, he is trying to quit using ecstasy.  Last use was yesterday.  He reports that tonight that he developed some sharp chest pain accompanied by shortness of breath, and sharp pain in his neck and upper back.  He denies cough, nasal congestion, rhinorrhea, nausea, vomiting, diarrhea, diaphoresis, numbness, weakness, paresthesias, fever, or chills.  He states that he is concerned that he may be having a panic attack.  He does use Xanax, but last use was 1 week ago.  He does not have a prescription from at this medication.  He denies cocaine use.  No IV drug use.  He does report drinking 5-6 beers 24 hours ago, but no alcohol use today.  Review of Systems  Positive: Chest pain, neck pain, shortness of breath, back pain Negative: Fever, chills, nasal congestion, rhinorrhea, nausea, vomiting, diarrhea, numbness, weakness, paresthesias, abdominal pain  Physical Exam  There were no vitals taken for this visit. Gen:   Awake, no distress   Resp:  Normal effort  MSK:   Moves extremities without difficulty  Other:  No reproducible tenderness palpation to the chest wall  Medical Decision Making  Medically screening exam initiated at 2:07 AM.  Appropriate orders placed.  Taiven Greenley was informed that the remainder of the evaluation will be completed by another provider, this initial triage assessment does not replace that evaluation, and the importance of remaining in the ED until their evaluation is complete.  25 year old male with a history of daily ecstasy use for several years.  Last use was 24 hours ago.  He is presenting with sharp chest pain, back pain, and neck pain accompanied by shortness of breath that began earlier to arrival.   Will order labs and chest x-ray.  Given the extensive differential diagnosis for his symptoms, he will require further work-up evaluation in the emergency department to rule out life-threatening causes of chest pain.    Barkley Boards, PA-C 05/15/21 0257    Glynn Octave, MD 05/15/21 [phone]

## 2021-05-27 ENCOUNTER — Other Ambulatory Visit: Payer: Self-pay

## 2021-05-27 ENCOUNTER — Ambulatory Visit (INDEPENDENT_AMBULATORY_CARE_PROVIDER_SITE_OTHER): Payer: 59 | Admitting: Emergency Medicine

## 2021-05-27 ENCOUNTER — Encounter: Payer: Self-pay | Admitting: Emergency Medicine

## 2021-05-27 VITALS — BP 122/68 | HR 72 | Temp 98.0°F | Ht 78.0 in | Wt 172.0 lb

## 2021-05-27 DIAGNOSIS — Z13220 Encounter for screening for lipoid disorders: Secondary | ICD-10-CM | POA: Diagnosis not present

## 2021-05-27 DIAGNOSIS — Z13228 Encounter for screening for other metabolic disorders: Secondary | ICD-10-CM | POA: Diagnosis not present

## 2021-05-27 DIAGNOSIS — Z0000 Encounter for general adult medical examination without abnormal findings: Secondary | ICD-10-CM | POA: Diagnosis not present

## 2021-05-27 DIAGNOSIS — Z130 Encounter for screening for diseases of the blood and blood-forming organs and certain disorders involving the immune mechanism: Secondary | ICD-10-CM

## 2021-05-27 DIAGNOSIS — Z716 Tobacco abuse counseling: Secondary | ICD-10-CM

## 2021-05-27 DIAGNOSIS — Z1329 Encounter for screening for other suspected endocrine disorder: Secondary | ICD-10-CM

## 2021-05-27 NOTE — Patient Instructions (Signed)
Health Maintenance, Male Adopting a healthy lifestyle and getting preventive care are important in promoting health and wellness. Ask your health care provider about: The right schedule for you to have regular tests and exams. Things you can do on your own to prevent diseases and keep yourself healthy. What should I know about diet, weight, and exercise? Eat a healthy diet  Eat a diet that includes plenty of vegetables, fruits, low-fat dairy products, and lean protein. Do not eat a lot of foods that are high in solid fats, added sugars, or sodium.  Maintain a healthy weight Body mass index (BMI) is a measurement that can be used to identify possible weight problems. It estimates body fat based on height and weight. Your health care provider can help determine your BMI and help you achieve or maintain ahealthy weight. Get regular exercise Get regular exercise. This is one of the most important things you can do for your health. Most adults should: Exercise for at least 150 minutes each week. The exercise should increase your heart rate and make you sweat (moderate-intensity exercise). Do strengthening exercises at least twice a week. This is in addition to the moderate-intensity exercise. Spend less time sitting. Even light physical activity can be beneficial. Watch cholesterol and blood lipids Have your blood tested for lipids and cholesterol at 26 years of age, then havethis test every 5 years. You may need to have your cholesterol levels checked more often if: Your lipid or cholesterol levels are high. You are older than 26 years of age. You are at high risk for heart disease. What should I know about cancer screening? Many types of cancers can be detected early and may often be prevented. Depending on your health history and family history, you may need to have cancer screening at various ages. This may include screening for: Colorectal cancer. Prostate cancer. Skin cancer. Lung  cancer. What should I know about heart disease, diabetes, and high blood pressure? Blood pressure and heart disease High blood pressure causes heart disease and increases the risk of stroke. This is more likely to develop in people who have high blood pressure readings, are of African descent, or are overweight. Talk with your health care provider about your target blood pressure readings. Have your blood pressure checked: Every 3-5 years if you are 18-39 years of age. Every year if you are 40 years old or older. If you are between the ages of 65 and 75 and are a current or former smoker, ask your health care provider if you should have a one-time screening for abdominal aortic aneurysm (AAA). Diabetes Have regular diabetes screenings. This checks your fasting blood sugar level. Have the screening done: Once every three years after age 45 if you are at a normal weight and have a low risk for diabetes. More often and at a younger age if you are overweight or have a high risk for diabetes. What should I know about preventing infection? Hepatitis B If you have a higher risk for hepatitis B, you should be screened for this virus. Talk with your health care provider to find out if you are at risk forhepatitis B infection. Hepatitis C Blood testing is recommended for: Everyone born from 1945 through 1965. Anyone with known risk factors for hepatitis C. Sexually transmitted infections (STIs) You should be screened each year for STIs, including gonorrhea and chlamydia, if: You are sexually active and are younger than 26 years of age. You are older than 26 years of age   and your health care provider tells you that you are at risk for this type of infection. Your sexual activity has changed since you were last screened, and you are at increased risk for chlamydia or gonorrhea. Ask your health care provider if you are at risk. Ask your health care provider about whether you are at high risk for HIV.  Your health care provider may recommend a prescription medicine to help prevent HIV infection. If you choose to take medicine to prevent HIV, you should first get tested for HIV. You should then be tested every 3 months for as long as you are taking the medicine. Follow these instructions at home: Lifestyle Do not use any products that contain nicotine or tobacco, such as cigarettes, e-cigarettes, and chewing tobacco. If you need help quitting, ask your health care provider. Do not use street drugs. Do not share needles. Ask your health care provider for help if you need support or information about quitting drugs. Alcohol use Do not drink alcohol if your health care provider tells you not to drink. If you drink alcohol: Limit how much you have to 0-2 drinks a day. Be aware of how much alcohol is in your drink. In the [place] one drink equals one 12 oz bottle of beer (355 mL), one 5 oz glass of wine (148 mL), or one 1 oz glass of hard liquor (44 mL). General instructions Schedule regular health, dental, and eye exams. Stay current with your vaccines. Tell your health care provider if: You often feel depressed. You have ever been abused or do not feel safe at home. Summary Adopting a healthy lifestyle and getting preventive care are important in promoting health and wellness. Follow your health care provider's instructions about healthy diet, exercising, and getting tested or screened for diseases. Follow your health care provider's instructions on monitoring your cholesterol and blood pressure. This information is not intended to replace advice given to you by your health care provider. Make sure you discuss any questions you have with your healthcare provider. Document Revised: 09/21/2018 Document Reviewed: 09/21/2018 Elsevier Patient Education  2022 [employer]vier Inc.  

## 2021-05-27 NOTE — Progress Notes (Signed)
[name] 25 y.o.   Chief Complaint  Patient presents with   Annual Exam    Pt would like a referral to physical therapy.    HISTORY OF PRESENT ILLNESS: This is a 25 y.o. male here for annual exam and to establish care with me. Smokes with occasional drug use. Otherwise healthy.  No chronic medical problems.  No chronic medications. No other complaints or medical concerns today.  HPI   Prior to Admission medications   Not on File    No Known Allergies  Patient Active Problem List   Diagnosis Date Noted   Altered mental status 01/02/2013    Past Medical History:  Diagnosis Date   Concussion     Past Surgical History:  Procedure Laterality Date   CLOSED REDUCTION METACARPAL WITH PERCUTANEOUS PINNING Right 06/05/2020   Procedure: CLOSED REDUCTION METACARPAL WITH PERCUTANEOUS PINNING;  Surgeon: Bradly Bienenstock, MD;  Location: [facility];  Service: Orthopedics;  Laterality: Right;  with IV sedation    Social History   Socioeconomic History   Marital status: Single    Spouse name: Not on file   Number of children: Not on file   Years of education: Not on file   Highest education level: Not on file  Occupational History   Not on file  Tobacco Use   Smoking status: Every Day    Packs/day: 0.50    Types: Cigarettes   Smokeless tobacco: Never  Vaping Use   Vaping Use: Every day   Substances: Nicotine, Flavoring  Substance and Sexual Activity   Alcohol use: No    Comment: occasionally   Drug use: Yes    Types: Marijuana    Comment: last 06-03-20   Sexual activity: Not on file  Other Topics Concern   Not on file  Social History Narrative   Not on file   Social Determinants of Health   Financial Resource Strain: Not on file  Food Insecurity: Not on file  Transportation Needs: Not on file  Physical Activity: Not on file  Stress: Not on file  Social Connections: Not on file  Intimate Partner Violence: Not on file    Family History   Problem Relation Age of Onset   Heart disease Maternal Grandmother    Cancer Maternal Grandmother    Kidney disease Maternal Grandfather    Cancer Paternal Grandmother      Review of Systems  Constitutional: Negative.  Negative for chills and fever.  HENT: Negative.  Negative for congestion and sore throat.   Eyes: Negative.   Respiratory: Negative.  Negative for cough and shortness of breath.   Cardiovascular: Negative.  Negative for chest pain and palpitations.  Gastrointestinal: Negative.  Negative for abdominal pain, diarrhea, nausea and vomiting.  Genitourinary: Negative.  Negative for dysuria and hematuria.  Musculoskeletal: Negative.  Negative for joint pain and myalgias.  Skin: Negative.  Negative for rash.  Neurological: Negative.  Negative for dizziness and headaches.  All other systems reviewed and are negative.  Today's Vitals   05/27/21 1600  BP: 122/68  Pulse: 72  Temp: 98 F (36.7 C)  TempSrc: Oral  SpO2: 99%  Weight: 172 lb (78 kg)  Height: 6\' 6"  (1.981 m)   Body mass index is 19.88 kg/m.  Physical Exam Vitals reviewed.  Constitutional:      Appearance: Normal appearance.  HENT:     Head: Normocephalic.     Right Ear: Tympanic membrane, ear canal and external ear normal.  Left Ear: Tympanic membrane, ear canal and external ear normal.     Mouth/Throat:     Mouth: Mucous membranes are moist.     Pharynx: Oropharynx is clear.  Eyes:     Extraocular Movements: Extraocular movements intact.     Conjunctiva/sclera: Conjunctivae normal.     Pupils: Pupils are equal, round, and reactive to light.  Cardiovascular:     Rate and Rhythm: Normal rate and regular rhythm.     Pulses: Normal pulses.     Heart sounds: Normal heart sounds.  Pulmonary:     Effort: Pulmonary effort is normal.     Breath sounds: Normal breath sounds.  Abdominal:     General: There is no distension.     Palpations: Abdomen is soft.     Tenderness: There is no abdominal  tenderness.  Musculoskeletal:        General: Normal range of motion.     Cervical back: Normal range of motion and neck supple.  Skin:    General: Skin is warm and dry.     Capillary Refill: Capillary refill takes less than 2 seconds.  Neurological:     General: No focal deficit present.     Mental Status: He is alert and oriented to person, place, and time.  Psychiatric:        Mood and Affect: Mood normal.        Behavior: Behavior normal.     ASSESSMENT & PLAN: Tyqwan was seen today for annual exam.  Diagnoses and all orders for this visit:  Routine general medical examination at a health care facility  Screening for deficiency anemia  Screening for lipoid disorders -     CBC with Differential  Screening for endocrine, metabolic and immunity disorder -     Comprehensive metabolic panel -     Hemoglobin A1c -     Lipid panel -     HIV antibody  Modifiable risk factors discussed with patient. Anticipatory guidance according to age provided. The following topics were also discussed: Social Determinants of Health Smoking cessation advise and cardiovascular and cancer risks associated with smoking Diet and nutrition Benefits of exercise Cancer family history review Vaccinations requirements Cardiovascular risk assessment Mental health including depression and anxiety Fall and accident prevention  Patient Instructions  Health Maintenance, Male Adopting a healthy lifestyle and getting preventive care are important in promoting health and wellness. Ask your health care provider about: The right schedule for you to have regular tests and exams. Things you can do on your own to prevent diseases and keep yourself healthy. What should I know about diet, weight, and exercise? Eat a healthy diet  Eat a diet that includes plenty of vegetables, fruits, low-fat dairy products, and lean protein. Do not eat a lot of foods that are high in solid fats, added sugars, or  sodium.  Maintain a healthy weight Body mass index (BMI) is a measurement that can be used to identify possible weight problems. It estimates body fat based on height and weight. Your health care provider can help determine your BMI and help you achieve or maintain ahealthy weight. Get regular exercise Get regular exercise. This is one of the most important things you can do for your health. Most adults should: Exercise for at least 150 minutes each week. The exercise should increase your heart rate and make you sweat (moderate-intensity exercise). Do strengthening exercises at least twice a week. This is in addition to the moderate-intensity exercise. Spend less  time sitting. Even light physical activity can be beneficial. Watch cholesterol and blood lipids Have your blood tested for lipids and cholesterol at 26 years of age, then havethis test every 5 years. You may need to have your cholesterol levels checked more often if: Your lipid or cholesterol levels are high. You are older than 26 years of age. You are at high risk for heart disease. What should I know about cancer screening? Many types of cancers can be detected early and may often be prevented. Depending on your health history and family history, you may need to have cancer screening at various ages. This may include screening for: Colorectal cancer. Prostate cancer. Skin cancer. Lung cancer. What should I know about heart disease, diabetes, and high blood pressure? Blood pressure and heart disease High blood pressure causes heart disease and increases the risk of stroke. This is more likely to develop in people who have high blood pressure readings, are of African descent, or are overweight. Talk with your health care provider about your target blood pressure readings. Have your blood pressure checked: Every 3-5 years if you are 8118-26 years of age. Every year if you are 83110 years old or older. If you are between the ages of 6665  and 4975 and are a current or former smoker, ask your health care provider if you should have a one-time screening for abdominal aortic aneurysm (AAA). Diabetes Have regular diabetes screenings. This checks your fasting blood sugar level. Have the screening done: Once every three years after age 26 if you are at a normal weight and have a low risk for diabetes. More often and at a younger age if you are overweight or have a high risk for diabetes. What should I know about preventing infection? Hepatitis B If you have a higher risk for hepatitis B, you should be screened for this virus. Talk with your health care provider to find out if you are at risk forhepatitis B infection. Hepatitis C Blood testing is recommended for: Everyone born from [birth year] through 1965. Anyone with known risk factors for hepatitis C. Sexually transmitted infections (STIs) You should be screened each year for STIs, including gonorrhea and chlamydia, if: You are sexually active and are younger than 26 years of age. You are older than 26 years of age and your health care provider tells you that you are at risk for this type of infection. Your sexual activity has changed since you were last screened, and you are at increased risk for chlamydia or gonorrhea. Ask your health care provider if you are at risk. Ask your health care provider about whether you are at high risk for HIV. Your health care provider may recommend a prescription medicine to help prevent HIV infection. If you choose to take medicine to prevent HIV, you should first get tested for HIV. You should then be tested every 3 months for as long as you are taking the medicine. Follow these instructions at home: Lifestyle Do not use any products that contain nicotine or tobacco, such as cigarettes, e-cigarettes, and chewing tobacco. If you need help quitting, ask your health care provider. Do not use street drugs. Do not share needles. Ask your health care provider  for help if you need support or information about quitting drugs. Alcohol use Do not drink alcohol if your health care provider tells you not to drink. If you drink alcohol: Limit how much you have to 0-2 drinks a day. Be aware of how much alcohol is  in your drink. In the [place] one drink equals one 12 oz bottle of beer (355 mL), one 5 oz glass of wine (148 mL), or one 1 oz glass of hard liquor (44 mL). General instructions Schedule regular health, dental, and eye exams. Stay current with your vaccines. Tell your health care provider if: You often feel depressed. You have ever been abused or do not feel safe at home. Summary Adopting a healthy lifestyle and getting preventive care are important in promoting health and wellness. Follow your health care provider's instructions about healthy diet, exercising, and getting tested or screened for diseases. Follow your health care provider's instructions on monitoring your cholesterol and blood pressure. This information is not intended to replace advice given to you by your health care provider. Make sure you discuss any questions you have with your healthcare provider. Document Revised: 09/21/2018 Document Reviewed: 09/21/2018 Elsevier Patient Education  2022 Elsevier Inc.    Edwina Barth, MD [facility]

## 2021-05-28 LAB — COMPREHENSIVE METABOLIC PANEL
ALT: 30 U/L (ref 0–53)
AST: 36 U/L (ref 0–37)
Albumin: 4.3 g/dL (ref 3.5–5.2)
Alkaline Phosphatase: 50 U/L (ref 39–117)
BUN: 14 mg/dL (ref 6–23)
CO2: 27 mEq/L (ref 19–32)
Calcium: 9.4 mg/dL (ref 8.4–10.5)
Chloride: 105 mEq/L (ref 96–112)
Creatinine, Ser: 0.98 mg/dL (ref 0.40–1.50)
GFR: 106.55 mL/min (ref >60.00)
Glucose, Bld: 77 mg/dL (ref 70–99)
Potassium: 4.1 mEq/L (ref 3.5–5.1)
Sodium: 140 mEq/L (ref 135–145)
Total Bilirubin: 0.3 mg/dL (ref 0.2–1.2)
Total Protein: 7.2 g/dL (ref 6.0–8.3)

## 2021-05-28 LAB — CBC WITH DIFFERENTIAL/PLATELET
Basophils Absolute: 0.1 10*3/uL (ref 0.0–0.1)
Basophils Relative: 1.1 % (ref 0.0–3.0)
Eosinophils Absolute: 0.2 10*3/uL (ref 0.0–0.7)
Eosinophils Relative: 2.9 % (ref 0.0–5.0)
HCT: 36 % — ABNORMAL LOW (ref 39.0–52.0)
Hemoglobin: 11.8 g/dL — ABNORMAL LOW (ref 13.0–17.0)
Lymphocytes Relative: 35.5 % (ref 12.0–46.0)
Lymphs Abs: 2.4 10*3/uL (ref 0.7–4.0)
MCHC: 32.8 g/dL (ref 30.0–36.0)
MCV: 80.2 fl (ref 78.0–100.0)
Monocytes Absolute: 0.7 10*3/uL (ref 0.1–1.0)
Monocytes Relative: 9.6 % (ref 3.0–12.0)
Neutro Abs: 3.5 10*3/uL (ref 1.4–7.7)
Neutrophils Relative %: 50.9 % (ref 43.0–77.0)
Platelets: 283 10*3/uL (ref 150.0–400.0)
RBC: 4.49 Mil/uL (ref 4.22–5.81)
RDW: 15.5 % (ref 11.5–15.5)
WBC: 6.9 10*3/uL (ref 4.0–10.5)

## 2021-05-28 LAB — HEMOGLOBIN A1C: Hgb A1c MFr Bld: 5.7 % (ref 4.6–6.5)

## 2021-05-28 LAB — LIPID PANEL
Cholesterol: 166 mg/dL (ref 0–200)
HDL: 83.4 mg/dL (ref >39.00)
LDL Cholesterol: 73 mg/dL (ref 0–99)
NonHDL: 82.7
Total CHOL/HDL Ratio: 2
Triglycerides: 47 mg/dL (ref 0.0–149.0)
VLDL: 9.4 mg/dL (ref 0.0–40.0)

## 2021-05-28 LAB — HIV ANTIBODY (ROUTINE TESTING W REFLEX): HIV 1&2 Ab, 4th Generation: NONREACTIVE

## 2021-09-05 ENCOUNTER — Ambulatory Visit
Admission: EM | Admit: 2021-09-05 | Discharge: 2021-09-05 | Disposition: A | Discharge status: Home / Self Care | Payer: 59 | Attending: Physician Assistant | Admitting: Physician Assistant

## 2021-09-05 ENCOUNTER — Other Ambulatory Visit: Payer: Self-pay

## 2021-09-05 ENCOUNTER — Encounter: Payer: Self-pay | Admitting: Emergency Medicine

## 2021-09-05 DIAGNOSIS — N5089 Other specified disorders of the male genital organs: Secondary | ICD-10-CM

## 2021-09-05 DIAGNOSIS — J351 Hypertrophy of tonsils: Secondary | ICD-10-CM | POA: Insufficient documentation

## 2021-09-05 MED ORDER — VALACYCLOVIR HCL 500 MG PO TABS: 500.0000 mg | ORAL_TABLET | Freq: Two times a day (BID) | ORAL | 0 refills | Status: AC

## 2021-09-05 MED ORDER — AMOXICILLIN-POT CLAVULANATE 875-125 MG PO TABS: 1.0000 | ORAL_TABLET | Freq: Two times a day (BID) | ORAL | 0 refills | Status: DC

## 2021-09-05 NOTE — ED Provider Notes (Signed)
[facility]    CSN: [id number] Arrival date & time: 09/05/21  1539      History   Chief Complaint Chief Complaint  Patient presents with   Lymphadenopathy    HPI Dakota [name] is a 25 y.o. male.   Patient here today for evaluation of swelling of his right tonsil with some swelling in the right side of his neck as well.  Reports he does have pain with swallowing.  He has not had fever.  He denies any cough.  He has not had nausea or vomiting.  He has tried over-the-counter medication without significant relief.  He also would like screening for genital herpes.  He states that his partner had informed him that she was positive, and he has noted genital lesions.  The history is provided by the patient.   Past Medical History:  Diagnosis Date   Concussion     Patient Active Problem List   Diagnosis Date Noted   Altered mental status 01/02/2013    Past Surgical History:  Procedure Laterality Date   CLOSED REDUCTION METACARPAL WITH PERCUTANEOUS PINNING Right 06/05/2020   Procedure: CLOSED REDUCTION METACARPAL WITH PERCUTANEOUS PINNING;  Surgeon: Bradly Bienenstock, MD;  Location: [facility];  Service: Orthopedics;  Laterality: Right;  with IV sedation       Home Medications    Prior to Admission medications   Medication Sig Start Date End Date Taking? Authorizing Provider  amoxicillin-clavulanate (AUGMENTIN) 875-125 MG tablet Take 1 tablet by mouth every 12 (twelve) hours. 09/05/21  Yes Tomi Bamberger, PA-C  valACYclovir (VALTREX) 500 MG tablet Take 1 tablet (500 mg total) by mouth 2 (two) times daily for 7 days. 09/05/21 09/12/21 Yes Tomi Bamberger, PA-C    Family History Family History  Problem Relation Age of Onset   Heart disease Maternal Grandmother    Cancer Maternal Grandmother    Kidney disease Maternal Grandfather    Cancer Paternal Grandmother     Social History Social History   Tobacco Use   Smoking status: Every Day     Packs/day: 0.50    Types: Cigarettes   Smokeless tobacco: Never  Vaping Use   Vaping Use: Every day   Substances: Nicotine, Flavoring  Substance Use Topics   Alcohol use: No    Comment: occasionally   Drug use: Yes    Types: Marijuana    Comment: last 06-03-20     Allergies   Patient has no known allergies.   Review of Systems Review of Systems  Constitutional:  Negative for chills and fever.  HENT:  Positive for sore throat and trouble swallowing. Negative for congestion and ear pain.   Eyes:  Negative for discharge and redness.  Respiratory:  Negative for cough and shortness of breath.   Gastrointestinal:  Negative for abdominal pain, nausea and vomiting.  Genitourinary:  Positive for genital sores.    Physical Exam Triage Vital Signs ED Triage Vitals [09/05/21 1736]  Enc Vitals Group     BP 125/66     Pulse Rate 66     Resp      Temp 98.2 F (36.8 C)     Temp Source Oral     SpO2 96 %     Weight 170 lb (77.1 kg)     Height 6\' 6"  (1.981 m)     Head Circumference      Peak Flow      Pain Score 8     Pain Loc  Pain Edu?      Excl. in GC?    No data found.  Updated Vital Signs BP 125/66 (BP Location: Left Arm)   Pulse 66   Temp 98.2 F (36.8 C) (Oral)   Ht 6\' 6"  (1.981 m)   Wt 170 lb (77.1 kg)   SpO2 96%   BMI 19.65 kg/m      Physical Exam Vitals and nursing note reviewed.  Constitutional:      General: He is not in acute distress.    Appearance: Normal appearance. He is not ill-appearing.  HENT:     Head: Normocephalic and atraumatic.     Nose: Nose normal. No congestion.     Mouth/Throat:     Mouth: Mucous membranes are moist.     Pharynx: Oropharynx is clear. Posterior oropharyngeal erythema present. No oropharyngeal exudate.     Comments: Right tonsil larger than left Eyes:     Conjunctiva/sclera: Conjunctivae normal.  Cardiovascular:     Rate and Rhythm: Normal rate and regular rhythm.     Heart sounds: Normal heart sounds. No  murmur heard. Pulmonary:     Effort: Pulmonary effort is normal. No respiratory distress.     Breath sounds: Normal breath sounds. No wheezing, rhonchi or rales.  Skin:    General: Skin is warm and dry.  Neurological:     Mental Status: He is alert.  Psychiatric:        Mood and Affect: Mood normal.        Thought Content: Thought content normal.     UC Treatments / Results  Labs (all labs ordered are listed, but only abnormal results are displayed) Labs Reviewed  HSV CULTURE AND TYPING    EKG   Radiology No results found.  Procedures Procedures (including critical care time)  Medications Ordered in UC Medications - No data to display  Initial Impression / Assessment and Plan / UC Course  I have reviewed the triage vital signs and the nursing notes.  Pertinent labs & imaging results that were available during my care of the patient were reviewed by me and considered in my medical decision making (see chart for details).    Antibiotic prescribed given significant swelling of right tonsil.  Recommended follow-up if this does not improve or worsens in any way.  Self swab performed for herpes screening.  Valacyclovir prescribed given known exposure.  Patient declines other STD screening.  Encouraged follow-up with any concerns.  Final Clinical Impressions(s) / UC Diagnoses   Final diagnoses:  Swelling of tonsil  Genital lesion, male   Discharge Instructions   None    ED Prescriptions     Medication Sig Dispense Auth. Provider   amoxicillin-clavulanate (AUGMENTIN) 875-125 MG tablet Take 1 tablet by mouth every 12 (twelve) hours. 14 tablet F, PA-C   valACYclovir (VALTREX) 500 MG tablet Take 1 tablet (500 mg total) by mouth 2 (two) times daily for 7 days. 14 tablet M, PA-C      PDMP not reviewed this encounter.   Tomi Bamberger, PA-C 09/06/21 [phone]

## 2021-09-05 NOTE — ED Triage Notes (Signed)
Patient c/o right sided swelling on neck and tonsils x 3 days.  No other sx's.  Also patient would like to be checked for genital herpes.  Patient's partner informed him that she tested positive for herpes.  Patient does have a genital herpes.

## 2021-09-08 LAB — HSV CULTURE AND TYPING

## 2022-05-11 IMAGING — CR DG HAND COMPLETE 3+V*R*
3 series · 3 of 3 positions shown · non-contrast
Comparison: No recent.

CLINICAL DATA: Patient punched a wall last p.m. right hand injury.

EXAM:
RIGHT HAND - COMPLETE 3+ VIEW

[x hand pa right]
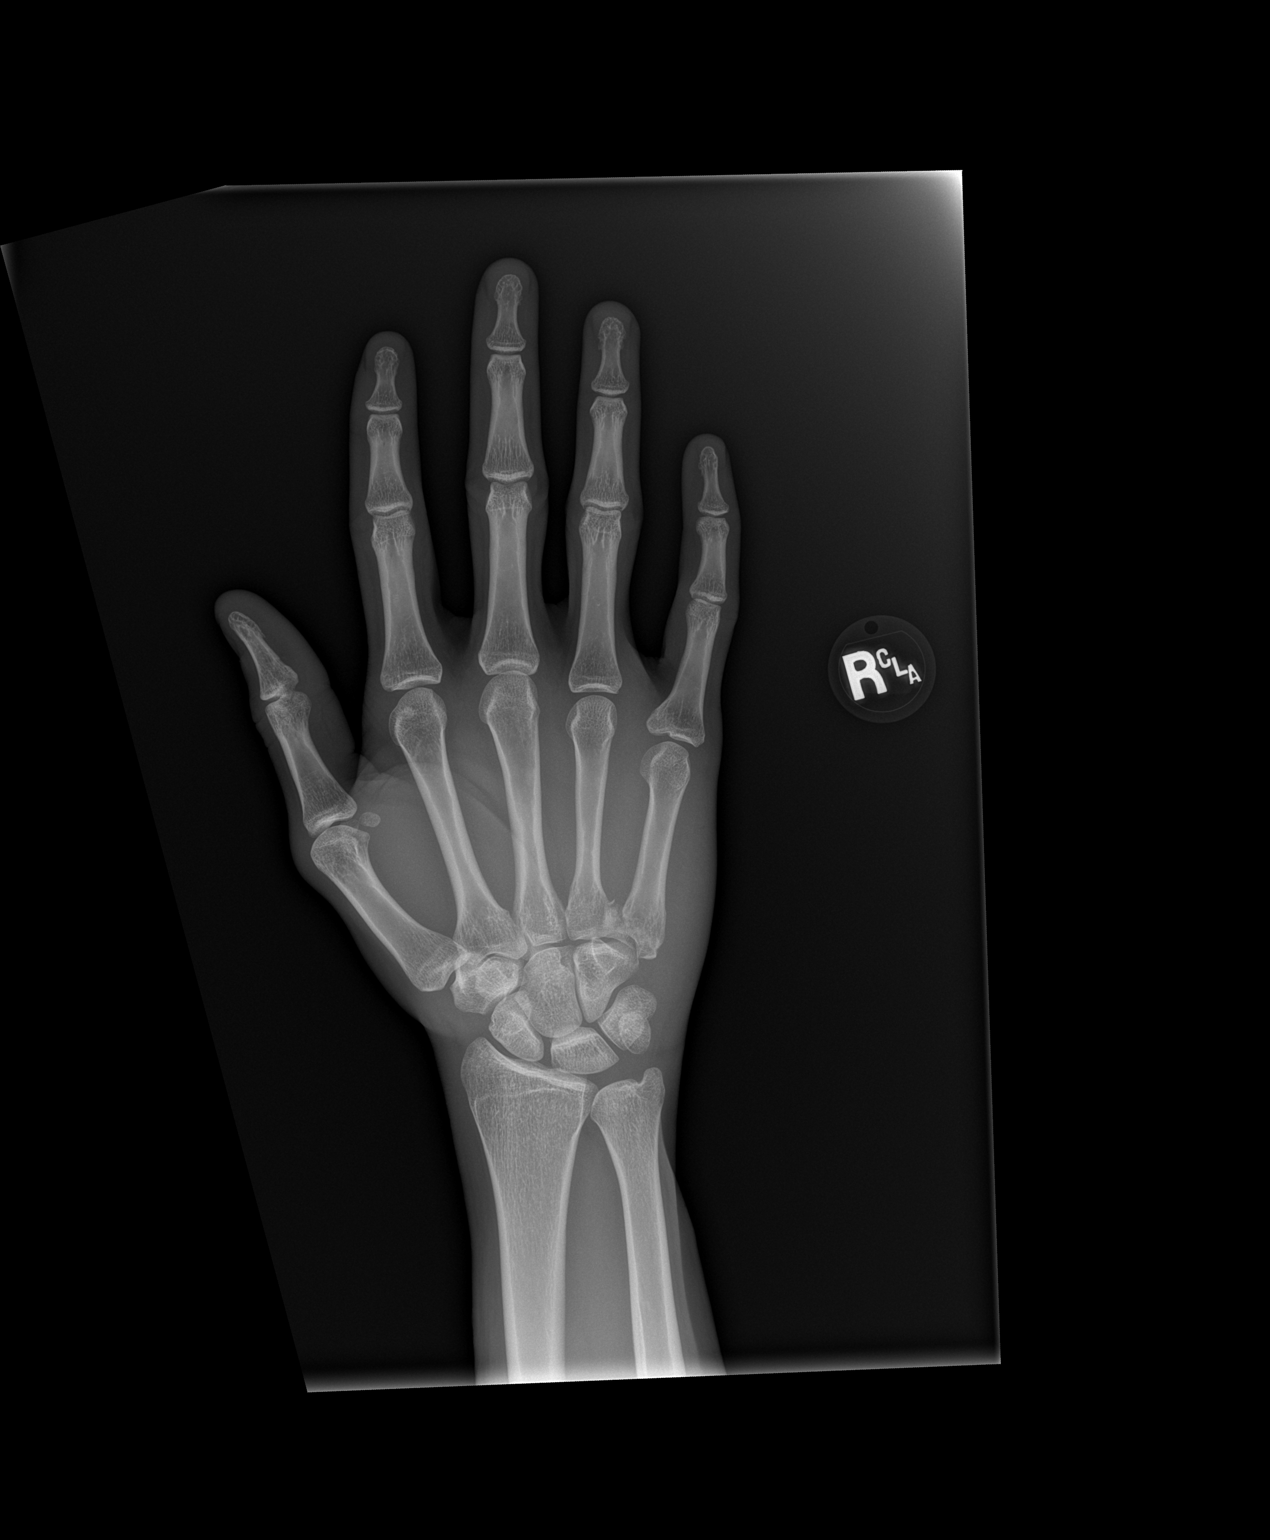

[x hand obl right]
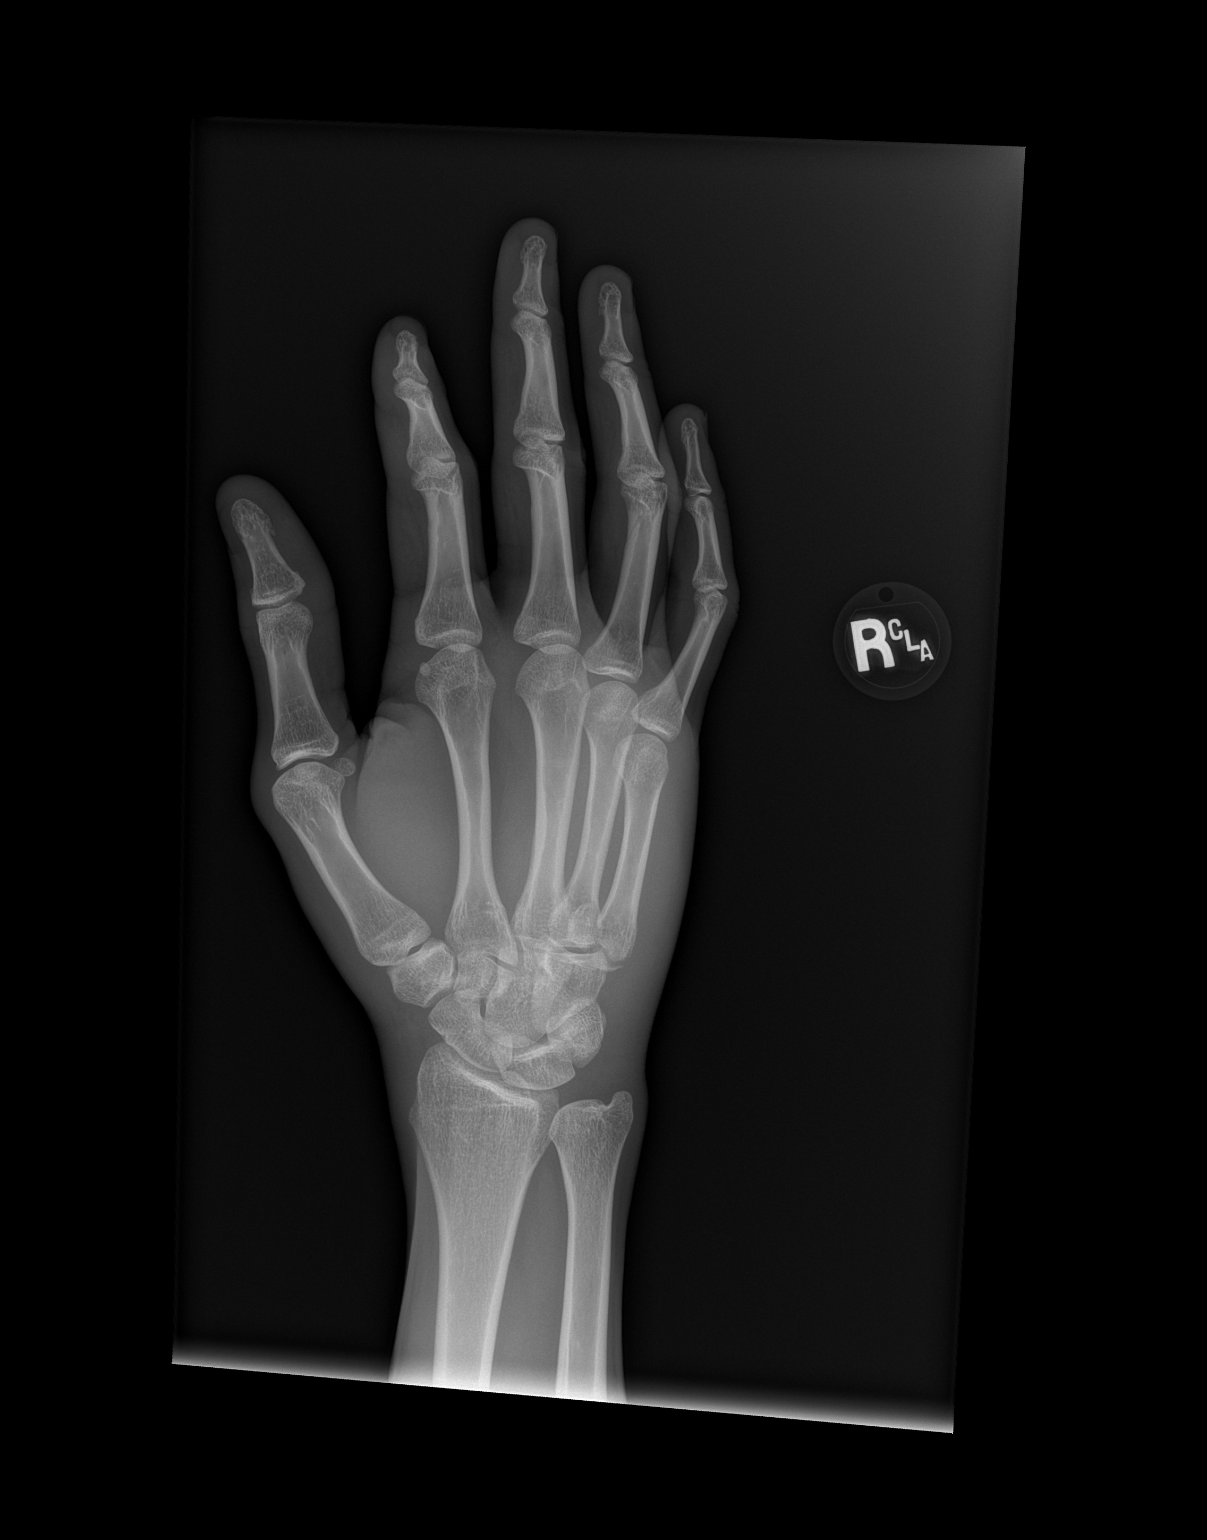

[x hand lat right]
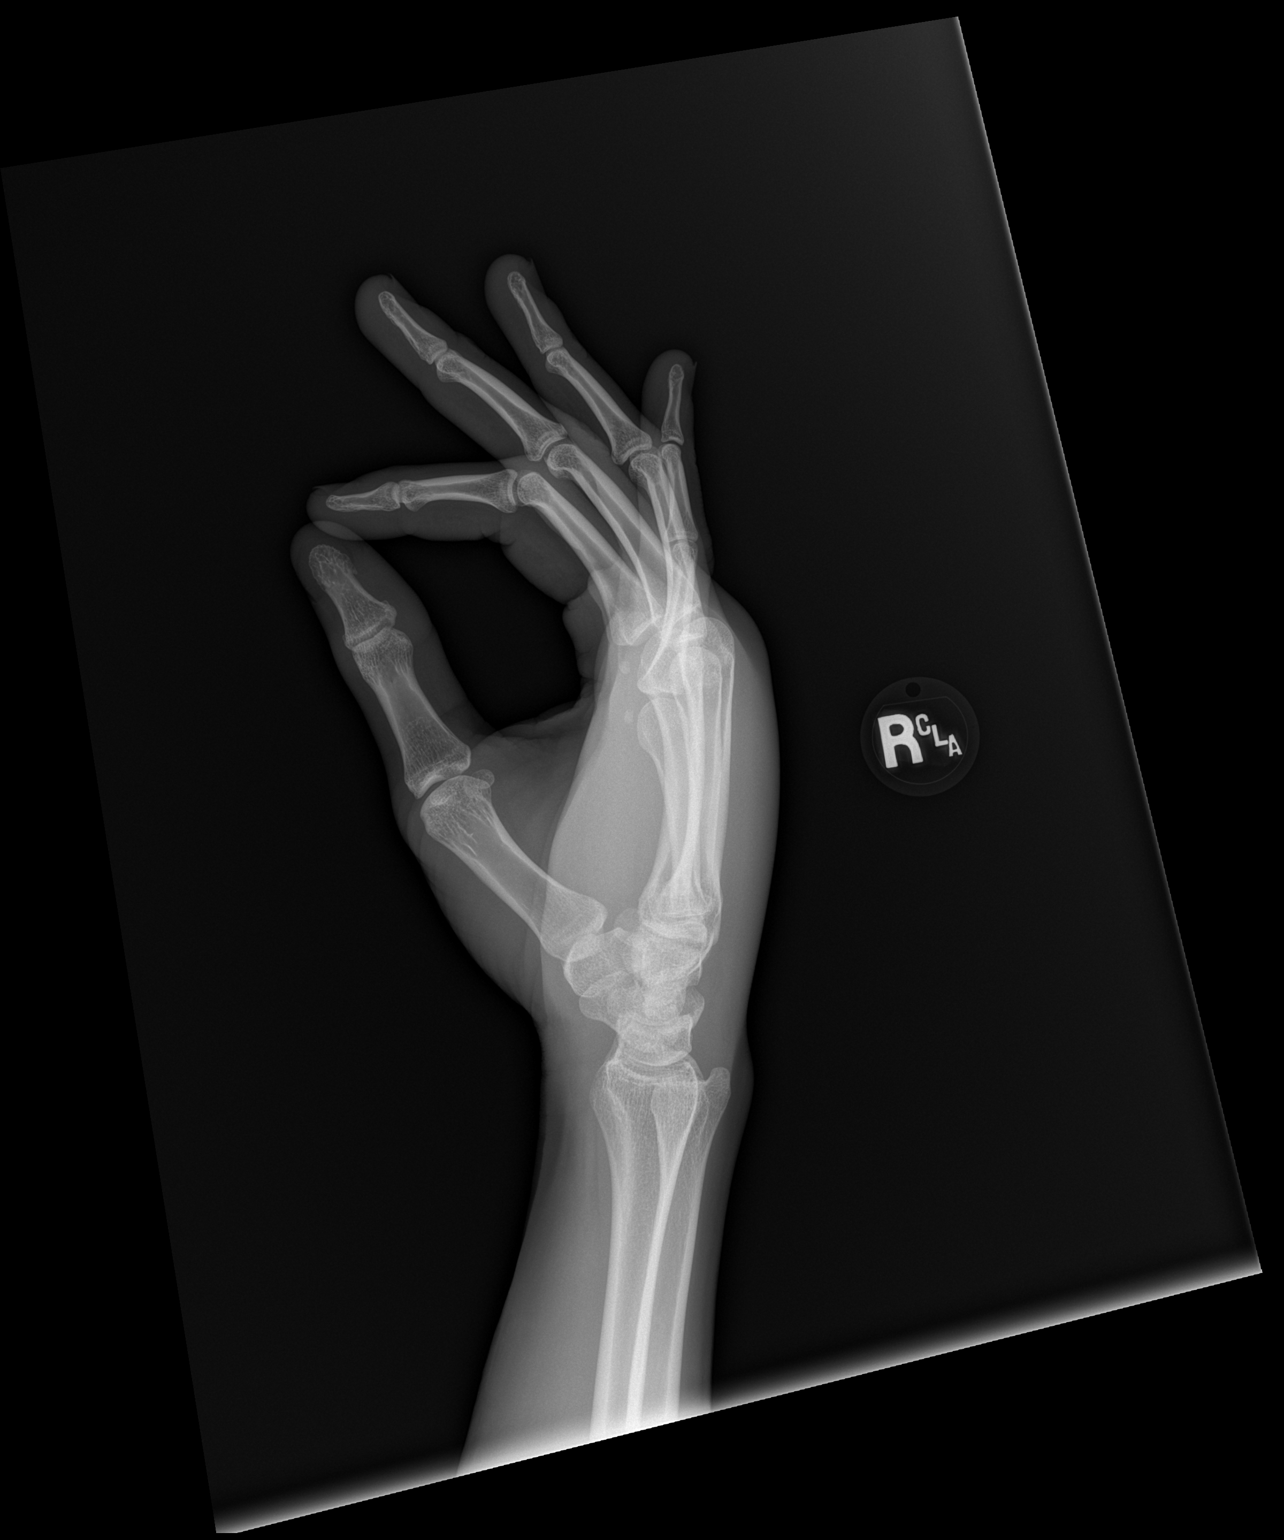

[3 of 3 positions shown; findings below may reference images not displayed]

FINDINGS: Displaced fracture of the base of the left fifth metacarpal noted.
Extension into the adjacent carpometacarpal joint space most likely
present. No evidence of dislocation. No other focal abnormality
identified. No radiopaque foreign body.
IMPRESSION: Displaced fracture of the base of the right fifth metacarpal.
Extension into the adjacent carpometacarpal joint space most likely
present.

## 2022-06-01 ENCOUNTER — Encounter: Payer: Self-pay | Admitting: Emergency Medicine

## 2022-06-01 ENCOUNTER — Ambulatory Visit
Admission: EM | Admit: 2022-06-01 | Discharge: 2022-06-01 | Disposition: A | Discharge status: Home / Self Care | Payer: Self-pay | Attending: Internal Medicine | Admitting: Internal Medicine

## 2022-06-01 DIAGNOSIS — M542 Cervicalgia: Secondary | ICD-10-CM

## 2022-06-01 DIAGNOSIS — S161XXA Strain of muscle, fascia and tendon at neck level, initial encounter: Secondary | ICD-10-CM

## 2022-06-01 MED ORDER — CYCLOBENZAPRINE HCL 5 MG PO TABS: 5.0000 mg | ORAL_TABLET | Freq: Two times a day (BID) | ORAL | 0 refills | Status: DC | PRN

## 2022-06-01 MED ORDER — IBUPROFEN 600 MG PO TABS: 600.0000 mg | ORAL_TABLET | Freq: Four times a day (QID) | ORAL | 0 refills | Status: DC | PRN

## 2022-06-01 NOTE — ED Provider Notes (Signed)
[facility]    CSN: [id number] Arrival date & time: 06/01/22  1547      History   Chief Complaint Chief Complaint  Patient presents with   Neck Pain    HPI Dakota [name] is a 26 y.o. male.   Patient presents with right-sided neck pain that started about a week ago.  Denies any obvious injury to the area.  Patient reports that he is having difficulty with rotation of his neck given associated pain.  Patient has taken 1 dose of ibuprofen today with minimal improvement.   Neck Pain   Past Medical History:  Diagnosis Date   Concussion     Patient Active Problem List   Diagnosis Date Noted   Altered mental status 01/02/2013    Past Surgical History:  Procedure Laterality Date   CLOSED REDUCTION METACARPAL WITH PERCUTANEOUS PINNING Right 06/05/2020   Procedure: CLOSED REDUCTION METACARPAL WITH PERCUTANEOUS PINNING;  Surgeon: Bradly Bienenstock, MD;  Location: [facility];  Service: Orthopedics;  Laterality: Right;  with IV sedation       Home Medications    Prior to Admission medications   Medication Sig Start Date End Date Taking? Authorizing Provider  cyclobenzaprine (FLEXERIL) 5 MG tablet Take 1 tablet (5 mg total) by mouth 2 (two) times daily as needed for muscle spasms. 06/01/22  Yes , Rolly Salter E, FNP  ibuprofen (ADVIL) 600 MG tablet Take 1 tablet (600 mg total) by mouth every 6 (six) hours as needed for mild pain. 06/01/22  Yes , Rolly Salter E, FNP  amoxicillin-clavulanate (AUGMENTIN) 875-125 MG tablet Take 1 tablet by mouth every 12 (twelve) hours. 09/05/21   Tomi Bamberger, PA-C    Family History Family History  Problem Relation Age of Onset   Heart disease Maternal Grandmother    Cancer Maternal Grandmother    Kidney disease Maternal Grandfather    Cancer Paternal Grandmother     Social History Social History   Tobacco Use   Smoking status: Every Day    Packs/day: 0.50    Types: Cigarettes   Smokeless tobacco: Never   Vaping Use   Vaping Use: Every day   Substances: Nicotine, Flavoring  Substance Use Topics   Alcohol use: No    Comment: occasionally   Drug use: Yes    Types: Marijuana    Comment: last 06-03-20     Allergies   Patient has no known allergies.   Review of Systems Review of Systems Per HPI  Physical Exam Triage Vital Signs ED Triage Vitals [06/01/22 1637]  Enc Vitals Group     BP 122/73     Pulse Rate 63     Resp 17     Temp 97.9 F (36.6 C)     Temp src      SpO2 99 %     Weight      Height      Head Circumference      Peak Flow      Pain Score 8     Pain Loc      Pain Edu?      Excl. in GC?    No data found.  Updated Vital Signs BP 122/73   Pulse 63   Temp 97.9 F (36.6 C)   Resp 17   SpO2 99%   Visual Acuity Right Eye Distance:   Left Eye Distance:   Bilateral Distance:    Right Eye Near:   Left Eye Near:    Bilateral Near:  Physical Exam Constitutional:      General: He is not in acute distress.    Appearance: Normal appearance. He is not toxic-appearing or diaphoretic.  HENT:     Head: Normocephalic and atraumatic.  Eyes:     Extraocular Movements: Extraocular movements intact.     Conjunctiva/sclera: Conjunctivae normal.  Neck:     Comments: Tenderness to palpation to right paraspinal muscles.  No direct spinal tenderness, crepitus, step-off.  No abrasions, lacerations, swelling, warmth noted.  Difficulty with range of motion and rotation to the right.  Patient can rotate neck to the left. Pulmonary:     Effort: Pulmonary effort is normal.  Musculoskeletal:     Cervical back: No edema, erythema or crepitus. Pain with movement and muscular tenderness present. No spinous process tenderness.  Neurological:     General: No focal deficit present.     Mental Status: He is alert and oriented to person, place, and time. Mental status is at baseline.  Psychiatric:        Mood and Affect: Mood normal.        Behavior: Behavior normal.         Thought Content: Thought content normal.        Judgment: Judgment normal.      UC Treatments / Results  Labs (all labs ordered are listed, but only abnormal results are displayed) Labs Reviewed - No data to display  EKG   Radiology No results found.  Procedures Procedures (including critical care time)  Medications Ordered in UC Medications - No data to display  Initial Impression / Assessment and Plan / UC Course  I have reviewed the triage vital signs and the nursing notes.  Pertinent labs & imaging results that were available during my care of the patient were reviewed by me and considered in my medical decision making (see chart for details).     Physical exam is consistent with muscular strain of the neck.  Will treat with muscle relaxer and NSAIDs.  Advised patient that muscle relaxer can cause drowsiness and to not drive or drink alcohol with taking this medication.  Advised patient to not take any additional NSAIDs while taking this prescription ibuprofen.  Discussed supportive care with patient as well.  Patient to follow-up if symptoms persist or worsen.  Patient verbalized understanding and was agreeable with plan. Final Clinical Impressions(s) / UC Diagnoses   Final diagnoses:  Strain of neck muscle, initial encounter  Neck pain     Discharge Instructions      It appears that you have a neck muscle strain.  This is being treated with ibuprofen and a muscle relaxer.  Please do not take any additional ibuprofen, Advil, or Aleve while taking this ibuprofen.  Please be advised that muscle relaxer can cause drowsiness so do not drive, drink alcohol, take illicit drugs, or operate heavy machinery while taking this medication.  Follow-up if symptoms persist or worsen.    ED Prescriptions     Medication Sig Dispense Auth. Provider   ibuprofen (ADVIL) 600 MG tablet Take 1 tablet (600 mg total) by mouth every 6 (six) hours as needed for mild pain. 30 tablet  [place], [place] E, [place]   cyclobenzaprine (FLEXERIL) 5 MG tablet Take 1 tablet (5 mg total) by mouth 2 (two) times daily as needed for muscle spasms. 20 tablet [place], Acie Fredrickson, [place]      PDMP not reviewed this encounter.   Gustavus Bryant, [place] 06/01/22 1734

## 2022-06-01 NOTE — ED Triage Notes (Signed)
Pt is present today with right side neck pain. Pt states that his pain started one week ago. Pt states that he took ibuprofen around couple hours ago

## 2022-06-01 NOTE — Discharge Instructions (Addendum)
It appears that you have a neck muscle strain.  This is being treated with ibuprofen and a muscle relaxer.  Please do not take any additional ibuprofen, Advil, or Aleve while taking this ibuprofen.  Please be advised that muscle relaxer can cause drowsiness so do not drive, drink alcohol, take illicit drugs, or operate heavy machinery while taking this medication.  Follow-up if symptoms persist or worsen.

## 2022-06-19 ENCOUNTER — Encounter (HOSPITAL_COMMUNITY): Payer: Self-pay | Admitting: Emergency Medicine

## 2022-06-19 ENCOUNTER — Emergency Department (HOSPITAL_COMMUNITY)
Admission: EM | Admit: 2022-06-19 | Discharge: 2022-06-19 | Disposition: A | Discharge status: Home / Self Care | Payer: Self-pay | Attending: Emergency Medicine | Admitting: Emergency Medicine

## 2022-06-19 DIAGNOSIS — J029 Acute pharyngitis, unspecified: Secondary | ICD-10-CM | POA: Insufficient documentation

## 2022-06-19 NOTE — ED Provider Notes (Signed)
[facility]-EMERGENCY DEPT Provider Note   CSN: [id number] Arrival date & time: 06/19/22  1543     History  Chief Complaint  Patient presents with   Sore Throat    Dakota [name] is a 26 y.o. male.   Sore Throat  Patient is a 26 year old male presented emergency room today because of a brief episode where he states he had some trouble breathing.  He states he believes it is because his tonsils enlarged.  He states it was a proximately 5 seconds when he woke up this morning because he felt he could not breathe.  He sat upright and was able to breathe afterwards.  He states he feels perfectly fine now.  He has some chronic neck pain which is unchanged.  No headache no nausea vomiting no rash no chest pain or other symptoms.  He was tested for COVID, flu, strep yesterday in urgent care was given amoxicillin and prednisone he has not started either of these medications.  He does not know the results of his testing yet.     Home Medications Prior to Admission medications   Medication Sig Start Date End Date Taking? Authorizing Provider  amoxicillin-clavulanate (AUGMENTIN) 875-125 MG tablet Take 1 tablet by mouth every 12 (twelve) hours. 09/05/21   Tomi Bamberger, PA-C  cyclobenzaprine (FLEXERIL) 5 MG tablet Take 1 tablet (5 mg total) by mouth 2 (two) times daily as needed for muscle spasms. 06/01/22   Gustavus Bryant, FNP  ibuprofen (ADVIL) 600 MG tablet Take 1 tablet (600 mg total) by mouth every 6 (six) hours as needed for mild pain. 06/01/22   Gustavus Bryant, FNP      Allergies    Patient has no known allergies.    Review of Systems   Review of Systems  Physical Exam Updated Vital Signs BP (!) 127/99   Pulse 75   Temp 98.1 F (36.7 C) (Oral)   Resp 18   Ht 6\' 6"  (1.981 m)   Wt 79.4 kg   SpO2 96%   BMI 20.22 kg/m  Physical Exam Vitals and nursing note reviewed.  Constitutional:      General: He is not in acute distress.    Appearance: Normal  appearance. He is not ill-appearing.  HENT:     Head: Normocephalic and atraumatic.     Mouth/Throat:     Comments: Posterior pharynx with clear PND.  No significant erythema or significant cobblestoning.  Uvula midline.  Tonsils 1+. Eyes:     General: No scleral icterus.       Right eye: No discharge.        Left eye: No discharge.     Conjunctiva/sclera: Conjunctivae normal.  Pulmonary:     Effort: Pulmonary effort is normal.     Breath sounds: No stridor.  Neurological:     Mental Status: He is alert and oriented to person, place, and time. Mental status is at baseline.     ED Results / Procedures / Treatments   Labs (all labs ordered are listed, but only abnormal results are displayed) Labs Reviewed - No data to display  EKG None  Radiology No results found.  Procedures Procedures    Medications Ordered in ED Medications - No data to display  ED Course/ Medical Decision Making/ A&P                           Medical Decision Making  Patient is  a 26 year old male presented emergency room today because of a brief episode where he states he had some trouble breathing.  He states he believes it is because his tonsils enlarged.  He states it was a proximately 5 seconds when he woke up this morning because he felt he could not breathe.  He sat upright and was able to breathe afterwards.  He states he feels perfectly fine now.  He has some chronic neck pain which is unchanged.  No headache no nausea vomiting no rash no chest pain or other symptoms.  He was tested for COVID, flu, strep yesterday in urgent care was given amoxicillin and prednisone he has not started either of these medications.  He does not know the results of his testing yet.  Patient is well-appearing breathing well normal phonation uvula midline I doubt deep space abscess or other significant infectious cause of his symptoms.  He had a brief episode this morning where he felt like he could not breathe which  resolved.  Will discharge home with strict return precautions.  He will follow-up with urgent care tomorrow for results of his testing.   Final Clinical Impression(s) / ED Diagnoses Final diagnoses:  Sore throat    Rx / DC Orders ED Discharge Orders     None         Gailen Shelter, [place] 06/19/22 2302    Maia Plan, MD 06/19/22 2333

## 2022-06-19 NOTE — ED Triage Notes (Signed)
Patient reports tonsil swelling x2 days ago. States seen at [facility] and prescribed abx and prednisone. Reports he has not started new medications because he was taking other medications for previous neck pain. Denies SOB. Speaking in full sentences without difficulty.

## 2022-06-19 NOTE — ED Provider Triage Note (Signed)
Emergency Medicine Provider Triage Evaluation Note  Dakota [name] , a 26 y.o. male  was evaluated in triage.  Pt complains of an episode this AM where he couldn't breathe it lasted for "three long drags" of breathing.   Was started on amox and pred 2 days ago at [facility] but had not had a strep result that he knows of.   No fever . States he feels well now.   Review of Systems  Positive: Neck pain Negative: Sore throat  Physical Exam  BP (!) 127/99   Pulse 75   Temp 98.1 F (36.7 C) (Oral)   Resp 18   Ht 6\' 6"  (1.981 m)   Wt 79.4 kg   SpO2 96%   BMI 20.22 kg/m  Gen:   Awake, no distress   Resp:  Normal effort  MSK:   Moves extremities without difficulty  Other:  Post pharynx clear, uvula midline  Medical Decision Making  Medically screening exam initiated at 4:41 PM.  Appropriate orders placed.  Dakota [name] was informed that the remainder of the evaluation will be completed by another provider, this initial triage assessment does not replace that evaluation, and the importance of remaining in the ED until their evaluation is complete.     [name] [place], [place] 06/19/22 1643

## 2022-06-19 NOTE — Discharge Instructions (Signed)
Please call the urgent care you were seen to see the results of your strep and COVID/flu testing.  Drink warm tea with honey, gargle warm salt water and use throat lozenges to ease any sore throat that you are experiencing.  I also recommend taking a 25 mg Benadryl every 6 hours for 3 doses to see if this helps with the sensation of swelling in your throat.

## 2022-09-08 ENCOUNTER — Emergency Department (HOSPITAL_COMMUNITY)
Admission: EM | Admit: 2022-09-08 | Discharge: 2022-09-08 | Discharge status: Against Medical Advice | Payer: Self-pay | Attending: Emergency Medicine | Admitting: Emergency Medicine

## 2022-09-08 ENCOUNTER — Other Ambulatory Visit: Payer: Self-pay

## 2022-09-08 DIAGNOSIS — F1113 Opioid abuse with withdrawal: Secondary | ICD-10-CM | POA: Insufficient documentation

## 2022-09-08 DIAGNOSIS — Z5321 Procedure and treatment not carried out due to patient leaving prior to being seen by health care provider: Secondary | ICD-10-CM | POA: Insufficient documentation

## 2022-09-08 NOTE — ED Triage Notes (Signed)
Pt bib ems c/o withdrawals from percocet and fentanyl, last use 2 days ago. Pt reports trouble sleeping and restless legs.

## 2022-09-09 ENCOUNTER — Observation Stay (HOSPITAL_COMMUNITY)
Admission: EM | Admit: 2022-09-09 | Discharge: 2022-09-10 | Disposition: A | Discharge status: Home / Self Care | Payer: Self-pay | Attending: Internal Medicine | Admitting: Internal Medicine

## 2022-09-09 ENCOUNTER — Emergency Department (HOSPITAL_COMMUNITY): Payer: Self-pay

## 2022-09-09 ENCOUNTER — Other Ambulatory Visit (HOSPITAL_BASED_OUTPATIENT_CLINIC_OR_DEPARTMENT_OTHER): Payer: Self-pay

## 2022-09-09 ENCOUNTER — Encounter (HOSPITAL_COMMUNITY): Payer: Self-pay | Admitting: Emergency Medicine

## 2022-09-09 ENCOUNTER — Other Ambulatory Visit: Payer: Self-pay

## 2022-09-09 DIAGNOSIS — F17210 Nicotine dependence, cigarettes, uncomplicated: Secondary | ICD-10-CM | POA: Insufficient documentation

## 2022-09-09 DIAGNOSIS — F1193 Opioid use, unspecified with withdrawal: Principal | ICD-10-CM | POA: Diagnosis present

## 2022-09-09 DIAGNOSIS — N179 Acute kidney failure, unspecified: Secondary | ICD-10-CM | POA: Insufficient documentation

## 2022-09-09 DIAGNOSIS — R400 Somnolence: Secondary | ICD-10-CM | POA: Insufficient documentation

## 2022-09-09 DIAGNOSIS — Z1152 Encounter for screening for COVID-19: Secondary | ICD-10-CM | POA: Insufficient documentation

## 2022-09-09 LAB — RESP PANEL BY RT-PCR (FLU A&B, COVID) ARPGX2
Influenza A by PCR: NEGATIVE
Influenza B by PCR: NEGATIVE
SARS Coronavirus 2 by RT PCR: NEGATIVE

## 2022-09-09 LAB — CBC WITH DIFFERENTIAL/PLATELET
Abs Immature Granulocytes: 0.03 10*3/uL (ref 0.00–0.07)
Basophils Absolute: 0.1 10*3/uL (ref 0.0–0.1)
Basophils Relative: 1 %
Eosinophils Absolute: 0 10*3/uL (ref 0.0–0.5)
Eosinophils Relative: 0 %
HCT: 40 % (ref 39.0–52.0)
Hemoglobin: 12.4 g/dL — ABNORMAL LOW (ref 13.0–17.0)
Immature Granulocytes: 0 %
Lymphocytes Relative: 23 %
Lymphs Abs: 2.3 10*3/uL (ref 0.7–4.0)
MCH: 25.2 pg — ABNORMAL LOW (ref 26.0–34.0)
MCHC: 31 g/dL (ref 30.0–36.0)
MCV: 81.3 fL (ref 80.0–100.0)
Monocytes Absolute: 0.8 10*3/uL (ref 0.1–1.0)
Monocytes Relative: 9 %
Neutro Abs: 6.7 10*3/uL (ref 1.7–7.7)
Neutrophils Relative %: 67 %
Platelets: 393 10*3/uL (ref 150–400)
RBC: 4.92 MIL/uL (ref 4.22–5.81)
RDW: 14.8 % (ref 11.5–15.5)
WBC: 9.9 10*3/uL (ref 4.0–10.5)
nRBC: 0 % (ref 0.0–0.2)

## 2022-09-09 LAB — COMPREHENSIVE METABOLIC PANEL
ALT: 23 U/L (ref 0–44)
AST: 21 U/L (ref 15–41)
Albumin: 4.2 g/dL (ref 3.5–5.0)
Alkaline Phosphatase: 48 U/L (ref 38–126)
Anion gap: 7 (ref 5–15)
BUN: 12 mg/dL (ref 6–20)
CO2: 23 mmol/L (ref 22–32)
Calcium: 9.4 mg/dL (ref 8.9–10.3)
Chloride: 110 mmol/L (ref 98–111)
Creatinine, Ser: 1.38 mg/dL — ABNORMAL HIGH (ref 0.61–1.24)
GFR, Estimated: >60 mL/min (ref >60)
Glucose, Bld: 100 mg/dL — ABNORMAL HIGH (ref 70–99)
Potassium: 4.2 mmol/L (ref 3.5–5.1)
Sodium: 140 mmol/L (ref 135–145)
Total Bilirubin: 0.7 mg/dL (ref 0.3–1.2)
Total Protein: 7.8 g/dL (ref 6.5–8.1)

## 2022-09-09 LAB — URINALYSIS, ROUTINE W REFLEX MICROSCOPIC
Bilirubin Urine: NEGATIVE
Glucose, UA: NEGATIVE mg/dL
Hgb urine dipstick: NEGATIVE
Ketones, ur: NEGATIVE mg/dL
Leukocytes,Ua: NEGATIVE
Nitrite: NEGATIVE
Protein, ur: 100 mg/dL — AB
Specific Gravity, Urine: 1.028 (ref 1.005–1.030)
pH: 6 (ref 5.0–8.0)

## 2022-09-09 LAB — RAPID URINE DRUG SCREEN, HOSP PERFORMED
Amphetamines: POSITIVE — AB
Barbiturates: NOT DETECTED
Benzodiazepines: POSITIVE — AB
Cocaine: POSITIVE — AB
Opiates: POSITIVE — AB
Tetrahydrocannabinol: POSITIVE — AB

## 2022-09-09 LAB — BLOOD GAS, VENOUS
Acid-Base Excess: 2.1 mmol/L — ABNORMAL HIGH (ref 0.0–2.0)
Bicarbonate: 25.5 mmol/L (ref 20.0–28.0)
O2 Saturation: 98.3 %
Patient temperature: 37
pCO2, Ven: 35 mmHg — ABNORMAL LOW (ref 44–60)
pH, Ven: 7.47 — ABNORMAL HIGH (ref 7.25–7.43)
pO2, Ven: 78 mmHg — ABNORMAL HIGH (ref 32–45)

## 2022-09-09 LAB — SALICYLATE LEVEL: Salicylate Lvl: <7 mg/dL — ABNORMAL LOW (ref 7.0–30.0)

## 2022-09-09 LAB — ETHANOL: Alcohol, Ethyl (B): <10 mg/dL (ref <10)

## 2022-09-09 LAB — ACETAMINOPHEN LEVEL: Acetaminophen (Tylenol), Serum: <10 ug/mL — ABNORMAL LOW (ref 10–30)

## 2022-09-09 MED ORDER — ENOXAPARIN SODIUM 40 MG/0.4ML IJ SOSY
40.0000 mg | PREFILLED_SYRINGE | INTRAMUSCULAR | Status: DC
  Administered 2022-09-09: 40 mg via SUBCUTANEOUS
  Filled 2022-09-09: qty 0.4

## 2022-09-09 MED ORDER — ACETAMINOPHEN 500 MG PO TABS
1000.0000 mg | ORAL_TABLET | Freq: Once | ORAL | Status: AC
  Administered 2022-09-09: 1000 mg via ORAL
  Filled 2022-09-09: qty 2

## 2022-09-09 MED ORDER — LACTATED RINGERS IV BOLUS
1000.0000 mL | Freq: Once | INTRAVENOUS | Status: AC
  Administered 2022-09-09: 1000 mL via INTRAVENOUS

## 2022-09-09 MED ORDER — THIAMINE HCL 100 MG/ML IJ SOLN
100.0000 mg | Freq: Every day | INTRAMUSCULAR | Status: DC
  Filled 2022-09-09: qty 2

## 2022-09-09 MED ORDER — LORAZEPAM 2 MG/ML IJ SOLN: 1.0000 mg | INTRAMUSCULAR | Status: DC | PRN

## 2022-09-09 MED ORDER — FOLIC ACID 1 MG PO TABS
1.0000 mg | ORAL_TABLET | Freq: Every day | ORAL | Status: DC
  Administered 2022-09-09 – 2022-09-10 (×2): 1 mg via ORAL
  Filled 2022-09-09 (×2): qty 1

## 2022-09-09 MED ORDER — MORPHINE SULFATE (PF) 2 MG/ML IV SOLN: 2.0000 mg | Freq: Once | INTRAVENOUS | Status: DC

## 2022-09-09 MED ORDER — LORAZEPAM 1 MG PO TABS: 1.0000 mg | ORAL_TABLET | ORAL | Status: DC | PRN

## 2022-09-09 MED ORDER — ADULT MULTIVITAMIN W/MINERALS CH
1.0000 | ORAL_TABLET | Freq: Every day | ORAL | Status: DC
  Administered 2022-09-09 – 2022-09-10 (×2): 1 via ORAL
  Filled 2022-09-09 (×2): qty 1

## 2022-09-09 MED ORDER — CLONIDINE HCL 0.1 MG PO TABS
0.1000 mg | ORAL_TABLET | Freq: Once | ORAL | Status: AC
  Administered 2022-09-09: 0.1 mg via ORAL
  Filled 2022-09-09: qty 1

## 2022-09-09 MED ORDER — ONDANSETRON 4 MG PO TBDP
4.0000 mg | ORAL_TABLET | Freq: Once | ORAL | Status: AC
  Administered 2022-09-09: 4 mg via ORAL
  Filled 2022-09-09: qty 1

## 2022-09-09 MED ORDER — THIAMINE MONONITRATE 100 MG PO TABS
100.0000 mg | ORAL_TABLET | Freq: Every day | ORAL | Status: DC
  Administered 2022-09-09 – 2022-09-10 (×2): 100 mg via ORAL
  Filled 2022-09-09 (×2): qty 1

## 2022-09-09 MED ORDER — LACTATED RINGERS IV BOLUS: 500.0000 mL | Freq: Once | INTRAVENOUS | Status: DC

## 2022-09-09 MED ORDER — LACTATED RINGERS IV SOLN: INTRAVENOUS | Status: DC

## 2022-09-09 NOTE — ED Provider Notes (Addendum)
[facility]-EMERGENCY DEPT Provider Note   CSN: [id number] Arrival date & time: 09/09/22  1233     History  Chief Complaint  Patient presents with   Weakness    Dakota [name] is a 26 y.o. male with no significant past medical history presents with generalized weakness, c/f opiate withdrawal.  History obtained from patient and his mother/brother at bedside.  Patient is very sleepy on exam and requires constant stimulation in order to stay awake to answer questions.  Patient presents from a hotel complaining of withdrawal symptoms for approximately 2 to 3 days.  The symptoms include nausea vomiting, generalized weakness, agitation, anxiety, not sleeping for 3 days, sweating, nasal congestion/rhinorrhea.  Girlfriend told EMS on scene that she tried to get patient up this morning but he fell onto the ground because he was too weak.  Patient came yesterday to be evaluated but left due to long wait time prior to being seen.  Patient reports is abusing both Percocet and fentanyl, by snorting it.  He uses every day.  Mother states that his use was interrupted because he could not get a hold of his cellar.  His family was largely unaware of his opiate dependence and he and his family are interested in detox/rehab.  At the current time patient is very sleepy and complaining of generalized bodyaches including back pain that is generalized.  He denies any fevers but endorses chills.  Endorses mild shortness of breath and nonbloody diarrhea as well.  Denies chest pain, abdominal pain, dysuria/hematuria, constipation.  He has had no seizure activity.   Weakness      Home Medications Prior to Admission medications   Medication Sig Start Date End Date Taking? Authorizing Provider  amoxicillin-clavulanate (AUGMENTIN) 875-125 MG tablet Take 1 tablet by mouth every 12 (twelve) hours. 09/05/21   Tomi Bamberger, PA-C  cyclobenzaprine (FLEXERIL) 5 MG tablet Take 1 tablet (5 mg  total) by mouth 2 (two) times daily as needed for muscle spasms. 06/01/22   Gustavus Bryant, FNP  ibuprofen (ADVIL) 600 MG tablet Take 1 tablet (600 mg total) by mouth every 6 (six) hours as needed for mild pain. 06/01/22   Gustavus Bryant, FNP      Allergies    Patient has no known allergies.    Review of Systems   Review of Systems  Neurological:  Positive for weakness.   Review of systems Negative for fever.  A 10 point review of systems was performed and is negative unless otherwise reported in HPI.  Physical Exam Updated Vital Signs BP 117/63 (BP Location: Right Arm)   Pulse 78   Temp 98.6 F (37 C) (Oral)   Resp 18   Ht 6\' 6"  (1.981 m)   Wt 79.4 kg   SpO2 100%   BMI 20.22 kg/m  Physical Exam General: Sleepy male, lying in bed.  HEENT: PERRLA midrange, Sclera anicteric, MMM, trachea midline. Rhinorrhea, minor yawning though patient is essentailly asleep Cardiology: RRR, no murmurs/rubs/gallops. BL radial and DP pulses equal bilaterally.  Resp: Normal respiratory rate and effort. CTAB, no wheezes, rhonchi, crackles.  Abd: Soft, non-tender, non-distended. No rebound tenderness or guarding.  GU: Deferred. MSK: No peripheral edema or signs of trauma. Extremities without deformity or TTP. No cyanosis or clubbing. Skin: warm, dry. No rashes or lesions. Back: No CVA tenderness. No e/o trauma to back, no C T or L spine midline tenderness.  Neuro: oriented x4, CNs II-XII grossly intact. MAEs. Sensation grossly intact. No  tremors or tongue fasciculations Psych: difficult to arouse but answers questions appropriately with stimulation  ED Results / Procedures / Treatments   Labs (all labs ordered are listed, but only abnormal results are displayed) Labs Reviewed  CBC WITH DIFFERENTIAL/PLATELET - Abnormal; Notable for the following components:      Result Value   Hemoglobin 12.4 (*)    MCH 25.2 (*)    All other components within normal limits  COMPREHENSIVE METABOLIC PANEL -  Abnormal; Notable for the following components:   Glucose, Bld 100 (*)    Creatinine, Ser 1.38 (*)    All other components within normal limits  ACETAMINOPHEN LEVEL - Abnormal; Notable for the following components:   Acetaminophen (Tylenol), Serum <10 (*)    All other components within normal limits  SALICYLATE LEVEL - Abnormal; Notable for the following components:   Salicylate Lvl <7.0 (*)    All other components within normal limits  ETHANOL  RAPID URINE DRUG SCREEN, HOSP PERFORMED  URINALYSIS, ROUTINE W REFLEX MICROSCOPIC    EKG EKG Interpretation  Date/Time:  Wednesday September 09 2022 12:41:59 EST Ventricular Rate:  72 PR Interval:  159 QRS Duration: 106 QT Interval:  394 QTC Calculation: 432 R Axis:   61 Text Interpretation: Sinus rhythm ST elev, probable normal early repol pattern Similar to prior EKGs Confirmed by Vivi Barrack [phone]) on 09/09/2022 4:09:31 PM  Radiology CT Head Wo Contrast  Result Date: 09/09/2022 CLINICAL DATA:  Mental status change, unknown cause EXAM: CT HEAD WITHOUT CONTRAST TECHNIQUE: Contiguous axial images were obtained from the base of the skull through the vertex without intravenous contrast. RADIATION DOSE REDUCTION: This exam was performed according to the departmental dose-optimization program which includes automated exposure control, adjustment of the mA and/or kV according to patient size and/or use of iterative reconstruction technique. COMPARISON:  03/30/2019. FINDINGS: Brain: No evidence of acute infarction, hemorrhage, hydrocephalus, extra-axial collection or mass lesion/mass effect. Vascular: No hyperdense vessel or unexpected calcification. Skull: Normal. Negative for fracture or focal lesion. Sinuses/Orbits: Mucoperiosteal thickening consistent with chronic bilateral maxillary sinusitis. IMPRESSION: No acute intracranial process. Electronically Signed   By: Layla Maw M.D.   On: 09/09/2022 20:06    Procedures Procedures     Medications Ordered in ED Medications  enoxaparin (LOVENOX) injection 40 mg (40 mg Subcutaneous Given 09/09/22 2153)  LORazepam (ATIVAN) tablet 1-4 mg (has no administration in time range)    Or  LORazepam (ATIVAN) injection 1-4 mg (has no administration in time range)  thiamine (VITAMIN B1) tablet 100 mg (100 mg Oral Given 09/09/22 2153)    Or  thiamine (VITAMIN B1) injection 100 mg ( Intravenous See Alternative 09/09/22 2153)  folic acid (FOLVITE) tablet 1 mg (1 mg Oral Given 09/09/22 2153)  multivitamin with minerals tablet 1 tablet (1 tablet Oral Given 09/09/22 2153)  lactated ringers infusion ( Intravenous New Bag/Given 09/09/22 2201)  ondansetron (ZOFRAN-ODT) disintegrating tablet 4 mg (4 mg Oral Given 09/09/22 1627)  cloNIDine (CATAPRES) tablet 0.1 mg (0.1 mg Oral Given 09/09/22 1803)  acetaminophen (TYLENOL) tablet 1,000 mg (1,000 mg Oral Given 09/09/22 1803)  lactated ringers bolus 1,000 mL (0 mLs Intravenous Stopped 09/09/22 2040)    ED Course/ Medical Decision Making/ A&P                          Medical Decision Making Amount and/or Complexity of Data Reviewed Labs: ordered. Decision-making details documented in ED Course. Radiology: ordered. Decision-making details documented in ED  Course.  Risk OTC drugs. Prescription drug management. Decision regarding hospitalization.   Patient is oriented but sleepy, HDS and afebrile.   Patient with report of 3 days of opiate withdrawal.  He does have symptoms of rhinorrhea, N/V, diarrhea, body aches, that point to opiate withdrawal. Also consider alcohol withdrawal though no tremors noted on exam. He has had no seizure activity. Patient is not currently agitated, which would be expected with opiate withdrawal. He is actually very sedated. Family states it is because he was so anxious/agitated he didn't sleep for 3 days. Will obtain labs to evaluate for co-ingestion, EtOH/acetaminophen/salicylate/UDS. Also consider electrolyte  abnormalities/renal injury, He is NCAT with no e/o head trauma and pupils are even and midrange. He is afebrile and vitally stable with constitutional symptoms such as body aches and rhinorrhea, will consider systemic viral infection and get flu/covid swabs. EKG with no signs of ischemia and no arrhythmia noted on EKG or on telemetry.   COWs score is 9, CIWA score is 11 currently. Will give patient clonidine 0.1 mg PO for patient's subjective feelings of withdrawal given clinical report , obtain labs, monitor patient's mental status.   I have personally reviewed and interpreted all labs and imaging.  Patient was maintained on a cardiac monitor.  I have personally interpreted the telemetry as NSR.  For further updates please see ED course.  ED Course:  Clinical Course as of 09/09/22 2032  Wed Sep 09, 2022  1744 Pan positive UDS [HN]  1745 Creatinine(!): 1.38 AKI up from BL 0.9-1 [HN]  1745 Will treat patient's symptoms initially with clonidine and fluids. Will reevaluate and consider further treatment with morphine or methadone as needed. Patient' and his family is interested in inpatient rehab/detox and TOC is consulted.  [HN]  1943 Patient persistently very sedated. Family at bedside states likely because he hasn't slept in 3 days. Patient very difficult to arouse with constant attention, requires flashlight in his eyes to arouse. Then is focally neuro intact and answers questions appropriately though very sleepy. I think most likely scenario is that patient was self-medicating his opiate withdrawal, especially given UDS +for benzos/THC/cocaine/amphetamines. Patient states that the clonidine helped him some but that he still has severe body aches, and he is still observed to be sniffling/yawning. Will perform CTH and VBG d/t excessive sleepiness and will consult to hospitalist for admission for opiate withdrawal, substance abuse, sedation. [HN]  2019 CT Head Wo Contrast No acute intracranial  process. [HN]  2030 Consulted to and admitted to medicine for further w/u. Pending VBG. [HN]    Clinical Course User Index [HN] Loetta Rough, MD          Final Clinical Impression(s) / ED Diagnoses Final diagnoses:  Opiate withdrawal (HCC)  AKI (acute kidney injury) (HCC)  Sleepiness    Rx / DC Orders ED Discharge Orders     None        This note was created using dictation software, which may contain spelling or grammatical errors.    Loetta Rough, MD 09/11/22 0740    Loetta Rough, MD 09/11/22 [phone]

## 2022-09-09 NOTE — Social Work (Signed)
CSW added resources to the patients.

## 2022-09-09 NOTE — Assessment & Plan Note (Signed)
-  UDS is positive for amphetamine, benzodiazepine, opioid, cocaine and THC and no barbiturates. -pt given Clonidine 0.1mg  in ED -He was not tachycardic and did not show signs of withdrawal on presentation but appears rather sedated. Suspect he likely has taken  substances in the past 24 hrs rather than 2-3 days as he reports.  -Will monitor with CIWA for now given his sedation. Will need to reassess clinical status in the morning before initiating buprenorphine.

## 2022-09-09 NOTE — Assessment & Plan Note (Signed)
elevated creatinine of 1.38 up from remote creatinine of 0.98 -continuous IV fluid hydration overnight and follow

## 2022-09-09 NOTE — ED Triage Notes (Signed)
Per [facility] pt coming from a motel c/o withdrawal symptoms x 3 days. Nausea, vomiting, weakness and feeling tired. Patient came yesterday but left due to wait time. Girlfriend informed EMS that she attempted to get pt up this morning and patient fell into the ground. Patient given 500CC NS.

## 2022-09-09 NOTE — ED Provider Triage Note (Signed)
Emergency Medicine Provider Triage Evaluation Note  Braelen Sproule , a 26 y.o. male  was evaluated in triage.  Pt complains of withdrawal symptoms including nausea, vomiting, chest pain.  Patient states he has been injecting fentanyl with his last injection 3 days ago.  He states symptoms have been increasing in severity since that time.  Review of Systems  Positive: As above Negative: As above  Physical Exam  BP 103/73 (BP Location: Left Arm)   Pulse 74   Temp 98.1 F (36.7 C) (Oral)   Resp 15   Ht 6\' 6"  (1.981 m)   Wt 79.4 kg   SpO2 99%   BMI 20.22 kg/m  Gen:   Awake, no distress   Resp:  Normal effort  MSK:   Moves extremities without difficulty  Other:    Medical Decision Making  Medically screening exam initiated at 12:49 PM.  Appropriate orders placed.  Nyshaun Standage was informed that the remainder of the evaluation will be completed by another provider, this initial triage assessment does not replace that evaluation, and the importance of remaining in the ED until their evaluation is complete.     [name], PA-C 09/09/22 1250

## 2022-09-09 NOTE — H&P (Signed)
History and Physical    Patient: Dakota [name] OZD:[id number] DOB: [birth year] DOA: 09/09/2022 DOS: the patient was seen and examined on 09/09/2022 PCP: Georgina Quint, MD  Patient coming from: Home  Chief Complaint:  Chief Complaint  Patient presents with   Weakness   HPI: Dakota [name] is a 26 y.o. male with medical history significant of polysubstance abuse who presents with withdrawal.  Patient limited historian and only answers when prompted with specific question.  He reports snorting fentanyl and other opioid about 2 to 3 days ago.  Discontinued because he ran out.  Has been feeling "uneasy."  Denies any nausea, vomiting or diarrhea.  In the ED, he was afebrile, normotensive on room air.  WBC of 9.9, hemoglobin of 12.4  No significant electrolyte abnormality other than elevated creatinine of 1.38 up from remote creatinine of 0.98.  Negative UA.  Alcohol level less than 10, salicylate level less than 7.  Acetaminophen level less than 10.  UDS is positive for amphetamine, benzodiazepine, opioid, cocaine and THC and no barbiturates.  CT head was negative for any acute processes.  EKG on my review with sinus rhythm and ST elevation in V2 to V3 likely repolarization.  He was given clonidine 0.1 mg tablet in the ED and hospitalist was consulted for admission due to increased sedation and opioid withdrawal. Review of Systems: Pertinent positives and negatives as above Past Medical History:  Diagnosis Date   Concussion    Past Surgical History:  Procedure Laterality Date   CLOSED REDUCTION METACARPAL WITH PERCUTANEOUS PINNING Right 06/05/2020   Procedure: CLOSED REDUCTION METACARPAL WITH PERCUTANEOUS PINNING;  Surgeon: Bradly Bienenstock, MD;  Location: [facility];  Service: Orthopedics;  Laterality: Right;  with IV sedation   Social History:  reports that he has been smoking cigarettes. He has been smoking an average of .5 packs per day. He has never used  smokeless tobacco. He reports current alcohol use. He reports current drug use. Drug: Marijuana.  No Known Allergies  Family History  Problem Relation Age of Onset   Heart disease Maternal Grandmother    Cancer Maternal Grandmother    Kidney disease Maternal Grandfather    Cancer Paternal Grandmother     Prior to Admission medications   Medication Sig Start Date End Date Taking? Authorizing Provider  amoxicillin-clavulanate (AUGMENTIN) 875-125 MG tablet Take 1 tablet by mouth every 12 (twelve) hours. 09/05/21   Tomi Bamberger, PA-C  cyclobenzaprine (FLEXERIL) 5 MG tablet Take 1 tablet (5 mg total) by mouth 2 (two) times daily as needed for muscle spasms. 06/01/22   Gustavus Bryant, FNP  ibuprofen (ADVIL) 600 MG tablet Take 1 tablet (600 mg total) by mouth every 6 (six) hours as needed for mild pain. 06/01/22   Gustavus Bryant, FNP    Physical Exam: Vitals:   09/09/22 1830 09/09/22 1900 09/09/22 2041 09/09/22 2041  BP: (!) 113/49 115/60 122/87   Pulse: 95 64 73   Resp: 14 15 17    Temp:    98.7 F (37.1 C)  TempSrc:    Oral  SpO2: 100% 99% 99%   Weight:      Height:       Constitutional: NAD, calm, comfortable, young male laying in bed mostly asleep with eyes closed throughout evaluation Eyes: PERRL, no pinpoint pupils noted, lids and conjunctivae normal ENMT: Mucous membranes are moist.  Neck: normal, supple Respiratory: clear to auscultation bilaterally, no wheezing, no crackles. Normal respiratory effort. No accessory muscle use.  Able to protect his airway. Cardiovascular: Regular rate and rhythm, no murmurs / rubs / gallops. No extremity edema.  Abdomen: no tenderness, Bowel sounds positive.  Musculoskeletal: no clubbing / cyanosis. No joint deformity upper and lower extremities. Good ROM, no contractures. Normal muscle tone.  Skin: no rashes, lesions, ulcers. No induration Neurologic: CN 2-12 grossly intact. Awoke easily to voice but remains asleep unless prompted with  questions   Psychiatric: Normal judgment and insight. Alert but fatigued. Normal mood. Data Reviewed:  See HPI  Assessment and Plan: * Opioid withdrawal (HCC) -UDS is positive for amphetamine, benzodiazepine, opioid, cocaine and THC and no barbiturates. -pt given Clonidine 0.1mg  in ED -He was not tachycardic and did not show signs of withdrawal on presentation but appears rather sedated. Suspect he likely has taken  substances in the past 24 hrs rather than 2-3 days as he reports.  -Will monitor with CIWA for now given his sedation. Will need to reassess clinical status in the morning before initiating buprenorphine.   AKI (acute kidney injury) (HCC) elevated creatinine of 1.38 up from remote creatinine of 0.98 -continuous IV fluid hydration overnight and follow      Advance Care Planning:   Code Status: Full Code   Consults: None  Family Communication: Family at bedside  Severity of Illness: The appropriate patient status for this patient is OBSERVATION. Observation status is judged to be reasonable and necessary in order to provide the required intensity of service to ensure the patient's safety. The patient's presenting symptoms, physical exam findings, and initial radiographic and laboratory data in the context of their medical condition is felt to place them at decreased risk for further clinical deterioration. Furthermore, it is anticipated that the patient will be medically stable for discharge from the hospital within 2 midnights of admission.   Author: Anselm Jungling, DO 09/09/2022 9:47 PM  For on call review [email or web address].

## 2022-09-10 LAB — CBC
HCT: 43.4 % (ref 39.0–52.0)
Hemoglobin: 13.6 g/dL (ref 13.0–17.0)
MCH: 25.4 pg — ABNORMAL LOW (ref 26.0–34.0)
MCHC: 31.3 g/dL (ref 30.0–36.0)
MCV: 81 fL (ref 80.0–100.0)
Platelets: 417 10*3/uL — ABNORMAL HIGH (ref 150–400)
RBC: 5.36 MIL/uL (ref 4.22–5.81)
RDW: 14.8 % (ref 11.5–15.5)
WBC: 10 10*3/uL (ref 4.0–10.5)
nRBC: 0 % (ref 0.0–0.2)

## 2022-09-10 LAB — BASIC METABOLIC PANEL
Anion gap: 7 (ref 5–15)
BUN: 14 mg/dL (ref 6–20)
CO2: 24 mmol/L (ref 22–32)
Calcium: 9.7 mg/dL (ref 8.9–10.3)
Chloride: 109 mmol/L (ref 98–111)
Creatinine, Ser: 1 mg/dL (ref 0.61–1.24)
GFR, Estimated: >60 mL/min (ref >60)
Glucose, Bld: 103 mg/dL — ABNORMAL HIGH (ref 70–99)
Potassium: 4 mmol/L (ref 3.5–5.1)
Sodium: 140 mmol/L (ref 135–145)

## 2022-09-10 LAB — HIV ANTIBODY (ROUTINE TESTING W REFLEX): HIV Screen 4th Generation wRfx: NONREACTIVE

## 2022-09-10 MED ORDER — FOLIC ACID 1 MG PO TABS: 1.0000 mg | ORAL_TABLET | Freq: Every day | ORAL | 0 refills | Status: DC

## 2022-09-10 MED ORDER — VITAMIN B-1 100 MG PO TABS: 100.0000 mg | ORAL_TABLET | Freq: Every day | ORAL | 0 refills | Status: DC

## 2022-09-10 MED ORDER — ADULT MULTIVITAMIN W/MINERALS CH: 1.0000 | ORAL_TABLET | Freq: Every day | ORAL | 0 refills | Status: DC

## 2022-09-10 NOTE — ED Notes (Signed)
Went in room to take lunch tray and patient isnt in room but belongings are in room.

## 2022-09-10 NOTE — ED Notes (Signed)
Per MD will discharge after lunch

## 2022-09-10 NOTE — Discharge Summary (Signed)
Physician Discharge Summary   Patient: Dakota [name] MRN: [id number] DOB: 1996/05/01  Admit date:     09/09/2022  Discharge date: 09/10/22  Discharge Physician: Lynden Oxford  PCP: Georgina Quint, MD  Recommendations at discharge:  Follow up with rehab and establish care per the resources given  Discharge Diagnoses: Principal Problem:   Opioid withdrawal (HCC) Active Problems:   AKI (acute kidney injury) (HCC)  Assessment and Plan  Polysubstance abuse  Opioid withdrawal UDS is positive for amphetamine, benzodiazepine, opioid, cocaine and THC and no barbiturates. pt given Clonidine 0.1mg  in ED He was not tachycardic and did not show signs of withdrawal on presentation.  Was sedated on admission but now alert and awake. Recommended outpt rehab. Initiating long acting medicine before addressing the polysubstance abuse is not ideal.   AKI (acute kidney injury) elevated creatinine of 1.38 up from remote creatinine of 0.98 Treated with IV fluid hydration  Consultants:  none  Procedures performed:  noen  DISCHARGE MEDICATION: Allergies as of 09/10/2022   No Known Allergies      Medication List     STOP taking these medications    cyclobenzaprine 5 MG tablet Commonly known as: FLEXERIL   ibuprofen 600 MG tablet Commonly known as: ADVIL       TAKE these medications    folic acid 1 MG tablet Commonly known as: FOLVITE Take 1 tablet (1 mg total) by mouth daily. Start taking on: September 11, 2022   multivitamin with minerals Tabs tablet Take 1 tablet by mouth daily. Start taking on: September 11, 2022   thiamine 100 MG tablet Commonly known as: Vitamin B-1 Take 1 tablet (100 mg total) by mouth daily. Start taking on: September 11, 2022       Disposition: Home Diet recommendation: Regular diet  Discharge Exam: Vitals:   09/10/22 0700 09/10/22 0800 09/10/22 0900 09/10/22 1000  BP: (!) 142/86 137/76 (!) 145/80 (!) 132/59  Pulse: 69 71 78 80   Resp: (!) 21 16 14 20   Temp: 98.5 F (36.9 C)   98.5 F (36.9 C)  TempSrc:      SpO2: 100% 100% 100% 100%  Weight:      Height:       General: Appear in mild distress; no visible Abnormal Neck Mass Or lumps, Conjunctiva normal Cardiovascular: S1 and S2 Present, no Murmur, Respiratory: good respiratory effort, Bilateral Air entry present and CTA, no Crackles, no wheezes Abdomen: Bowel Sound present, Non tender  Extremities: no Pedal edema Neurology: alert and oriented to time, place, and person  Filed Weights   09/09/22 1242  Weight: 79.4 kg   Condition at discharge: stable  The results of significant diagnostics from this hospitalization (including imaging, microbiology, ancillary and laboratory) are listed below for reference.   Imaging Studies: CT Head Wo Contrast  Result Date: 09/09/2022 CLINICAL DATA:  Mental status change, unknown cause EXAM: CT HEAD WITHOUT CONTRAST TECHNIQUE: Contiguous axial images were obtained from the base of the skull through the vertex without intravenous contrast. RADIATION DOSE REDUCTION: This exam was performed according to the departmental dose-optimization program which includes automated exposure control, adjustment of the mA and/or kV according to patient size and/or use of iterative reconstruction technique. COMPARISON:  03/30/2019. FINDINGS: Brain: No evidence of acute infarction, hemorrhage, hydrocephalus, extra-axial collection or mass lesion/mass effect. Vascular: No hyperdense vessel or unexpected calcification. Skull: Normal. Negative for fracture or focal lesion. Sinuses/Orbits: Mucoperiosteal thickening consistent with chronic bilateral maxillary sinusitis. IMPRESSION: No acute intracranial process. Electronically  Signed   By: Layla Maw M.D.   On: 09/09/2022 20:06    Microbiology: Results for orders placed or performed during the hospital encounter of 09/09/22  Resp Panel by RT-PCR (Flu A&B, Covid) Anterior Nasal Swab     Status:  None   Collection Time: 09/09/22  5:32 PM   Specimen: Anterior Nasal Swab  Result Value Ref Range Status   SARS Coronavirus 2 by RT PCR NEGATIVE NEGATIVE Final    Comment: (NOTE) SARS-CoV-2 target nucleic acids are NOT DETECTED.  The SARS-CoV-2 RNA is generally detectable in upper respiratory specimens during the acute phase of infection. The lowest concentration of SARS-CoV-2 viral copies this assay can detect is 138 copies/mL. A negative result does not preclude SARS-Cov-2 infection and should not be used as the sole basis for treatment or other patient management decisions. A negative result may occur with  improper specimen collection/handling, submission of specimen other than nasopharyngeal swab, presence of viral mutation(s) within the areas targeted by this assay, and inadequate number of viral copies(<138 copies/mL). A negative result must be combined with clinical observations, patient history, and epidemiological information. The expected result is Negative.  Fact Sheet for Patients:  BloggerCourse.com  Fact Sheet for Healthcare Providers:  SeriousBroker.it  This test is no t yet approved or cleared by the [place] FDA and  has been authorized for detection and/or diagnosis of SARS-CoV-2 by FDA under an Emergency Use Authorization (EUA). This EUA will remain  in effect (meaning this test can be used) for the duration of the COVID-19 declaration under Section 564(b)(1) of the Act, 21 U.S.C.section 360bbb-3(b)(1), unless the authorization is terminated  or revoked sooner.       Influenza A by PCR NEGATIVE NEGATIVE Final   Influenza B by PCR NEGATIVE NEGATIVE Final    Comment: (NOTE) The Xpert Xpress SARS-CoV-2/FLU/RSV plus assay is intended as an aid in the diagnosis of influenza from Nasopharyngeal swab specimens and should not be used as a sole basis for treatment. Nasal washings and aspirates are unacceptable  for Xpert Xpress SARS-CoV-2/FLU/RSV testing.  Fact Sheet for Patients: BloggerCourse.com  Fact Sheet for Healthcare Providers: SeriousBroker.it  This test is not yet approved or cleared by the [place] FDA and has been authorized for detection and/or diagnosis of SARS-CoV-2 by FDA under an Emergency Use Authorization (EUA). This EUA will remain in effect (meaning this test can be used) for the duration of the COVID-19 declaration under Section 564(b)(1) of the Act, 21 U.S.C. section 360bbb-3(b)(1), unless the authorization is terminated or revoked.  Performed at [facility], [address] [place], [place] [address]    Labs: CBC: Recent Labs  Lab 09/09/22 1313 09/10/22 0738  WBC 9.9 10.0  NEUTROABS 6.7  --   HGB 12.4* 13.6  HCT 40.0 43.4  MCV 81.3 81.0  PLT 393 417*   Basic Metabolic Panel: Recent Labs  Lab 09/09/22 1313 09/10/22 0738  NA 140 140  K 4.2 4.0  CL 110 109  CO2 23 24  GLUCOSE 100* 103*  BUN 12 14  CREATININE 1.38* 1.00  CALCIUM 9.4 9.7   Liver Function Tests: Recent Labs  Lab 09/09/22 1313  AST 21  ALT 23  ALKPHOS 48  BILITOT 0.7  PROT 7.8  ALBUMIN 4.2   CBG: No results for input(s): "GLUCAP" in the last 168 hours.  Discharge time spent: greater than 30 minutes.  Signed: Lynden Oxford, MD [facility]

## 2023-03-19 ENCOUNTER — Ambulatory Visit
Admission: EM | Admit: 2023-03-19 | Discharge: 2023-03-19 | Disposition: A | Discharge status: Home / Self Care | Payer: Self-pay | Attending: Family Medicine | Admitting: Family Medicine

## 2023-03-19 DIAGNOSIS — M542 Cervicalgia: Secondary | ICD-10-CM

## 2023-03-19 MED ORDER — KETOROLAC TROMETHAMINE 30 MG/ML IJ SOLN
30.0000 mg | Freq: Once | INTRAMUSCULAR | Status: AC
  Administered 2023-03-19: 30 mg via INTRAMUSCULAR

## 2023-03-19 MED ORDER — KETOROLAC TROMETHAMINE 10 MG PO TABS: 10.0000 mg | ORAL_TABLET | Freq: Four times a day (QID) | ORAL | 0 refills | Status: DC | PRN

## 2023-03-19 MED ORDER — TIZANIDINE HCL 4 MG PO TABS: 4.0000 mg | ORAL_TABLET | Freq: Three times a day (TID) | ORAL | 0 refills | Status: DC | PRN
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
## 2023-03-19 NOTE — Discharge Instructions (Signed)
You have been given a shot of Toradol 30 mg today.  Ketorolac 10 mg tablets--take 1 tablet every 6 hours as needed for pain.  This is the same medicine that is in the shot we just gave you   Take tizanidine 4 mg--1 every 8 hours as needed for muscle spasms; this medication can cause dizziness and sleepiness   

## 2023-05-13 ENCOUNTER — Encounter: Payer: Self-pay | Admitting: Emergency Medicine

## 2023-05-19 ENCOUNTER — Encounter: Payer: Self-pay | Admitting: Emergency Medicine

## 2023-07-12 ENCOUNTER — Emergency Department (HOSPITAL_COMMUNITY)
Admission: EM | Admit: 2023-07-12 | Discharge: 2023-07-13 | Disposition: A | Discharge status: Other Acute Inpatient Hospital | Payer: BLUE CROSS/BLUE SHIELD | Attending: Emergency Medicine | Admitting: Emergency Medicine

## 2023-07-12 ENCOUNTER — Encounter (HOSPITAL_COMMUNITY): Payer: Self-pay | Admitting: Registered Nurse

## 2023-07-12 ENCOUNTER — Other Ambulatory Visit: Payer: Self-pay

## 2023-07-12 ENCOUNTER — Ambulatory Visit (INDEPENDENT_AMBULATORY_CARE_PROVIDER_SITE_OTHER)
Admission: EM | Admit: 2023-07-12 | Discharge: 2023-07-12 | Disposition: A | Discharge status: Other Acute Inpatient Hospital | Payer: BLUE CROSS/BLUE SHIELD | Source: Home / Self Care

## 2023-07-12 DIAGNOSIS — F1310 Sedative, hypnotic or anxiolytic abuse, uncomplicated: Secondary | ICD-10-CM | POA: Insufficient documentation

## 2023-07-12 DIAGNOSIS — F1123 Opioid dependence with withdrawal: Secondary | ICD-10-CM | POA: Insufficient documentation

## 2023-07-12 DIAGNOSIS — Z0283 Encounter for blood-alcohol and blood-drug test: Secondary | ICD-10-CM

## 2023-07-12 DIAGNOSIS — F1994 Other psychoactive substance use, unspecified with psychoactive substance-induced mood disorder: Secondary | ICD-10-CM | POA: Diagnosis not present

## 2023-07-12 DIAGNOSIS — F1193 Opioid use, unspecified with withdrawal: Secondary | ICD-10-CM | POA: Diagnosis not present

## 2023-07-12 DIAGNOSIS — F1120 Opioid dependence, uncomplicated: Secondary | ICD-10-CM | POA: Diagnosis present

## 2023-07-12 LAB — URINALYSIS, ROUTINE W REFLEX MICROSCOPIC
Bilirubin Urine: NEGATIVE
Glucose, UA: NEGATIVE mg/dL
Hgb urine dipstick: NEGATIVE
Ketones, ur: NEGATIVE mg/dL
Leukocytes,Ua: NEGATIVE
Nitrite: NEGATIVE
Protein, ur: NEGATIVE mg/dL
Specific Gravity, Urine: 1.009 (ref 1.005–1.030)
pH: 8 (ref 5.0–8.0)

## 2023-07-12 LAB — BASIC METABOLIC PANEL
Anion gap: 11 (ref 5–15)
BUN: <5 mg/dL — ABNORMAL LOW (ref 6–20)
CO2: 24 mmol/L (ref 22–32)
Calcium: 9.5 mg/dL (ref 8.9–10.3)
Chloride: 106 mmol/L (ref 98–111)
Creatinine, Ser: 1.05 mg/dL (ref 0.61–1.24)
GFR, Estimated: >60 mL/min (ref >60)
Glucose, Bld: 122 mg/dL — ABNORMAL HIGH (ref 70–99)
Potassium: 3.7 mmol/L (ref 3.5–5.1)
Sodium: 141 mmol/L (ref 135–145)

## 2023-07-12 LAB — POCT URINE DRUG SCREEN - MANUAL ENTRY (I-SCREEN)
POC Amphetamine UR: NOT DETECTED
POC Buprenorphine (BUP): NOT DETECTED
POC Cocaine UR: NOT DETECTED
POC Marijuana UR: POSITIVE — AB
POC Methadone UR: NOT DETECTED
POC Methamphetamine UR: NOT DETECTED
POC Morphine: POSITIVE — AB
POC Oxazepam (BZO): POSITIVE — AB
POC Oxycodone UR: POSITIVE — AB
POC Secobarbital (BAR): NOT DETECTED

## 2023-07-12 LAB — CBC WITH DIFFERENTIAL/PLATELET
Abs Immature Granulocytes: 0.03 10*3/uL (ref 0.00–0.07)
Basophils Absolute: 0 10*3/uL (ref 0.0–0.1)
Basophils Relative: 1 %
Eosinophils Absolute: 0.1 10*3/uL (ref 0.0–0.5)
Eosinophils Relative: 2 %
HCT: 35.3 % — ABNORMAL LOW (ref 39.0–52.0)
Hemoglobin: 11.5 g/dL — ABNORMAL LOW (ref 13.0–17.0)
Immature Granulocytes: 0 %
Lymphocytes Relative: 18 %
Lymphs Abs: 1.4 10*3/uL (ref 0.7–4.0)
MCH: 25.6 pg — ABNORMAL LOW (ref 26.0–34.0)
MCHC: 32.6 g/dL (ref 30.0–36.0)
MCV: 78.4 fL — ABNORMAL LOW (ref 80.0–100.0)
Monocytes Absolute: 0.3 10*3/uL (ref 0.1–1.0)
Monocytes Relative: 4 %
Neutro Abs: 5.9 10*3/uL (ref 1.7–7.7)
Neutrophils Relative %: 75 %
Platelets: 405 10*3/uL — ABNORMAL HIGH (ref 150–400)
RBC: 4.5 MIL/uL (ref 4.22–5.81)
RDW: 14 % (ref 11.5–15.5)
WBC: 7.8 10*3/uL (ref 4.0–10.5)
nRBC: 0 % (ref 0.0–0.2)

## 2023-07-12 LAB — LIPID PANEL
Cholesterol: 142 mg/dL (ref 0–200)
HDL: 56 mg/dL (ref >40)
LDL Cholesterol: 75 mg/dL (ref 0–99)
Total CHOL/HDL Ratio: 2.5 {ratio}
Triglycerides: 57 mg/dL (ref <150)
VLDL: 11 mg/dL (ref 0–40)

## 2023-07-12 LAB — COMPREHENSIVE METABOLIC PANEL
ALT: 20 U/L (ref 0–44)
AST: 24 U/L (ref 15–41)
Albumin: 4.4 g/dL (ref 3.5–5.0)
Alkaline Phosphatase: 60 U/L (ref 38–126)
Anion gap: 12 (ref 5–15)
BUN: <5 mg/dL — ABNORMAL LOW (ref 6–20)
CO2: 25 mmol/L (ref 22–32)
Calcium: 9.6 mg/dL (ref 8.9–10.3)
Chloride: 101 mmol/L (ref 98–111)
Creatinine, Ser: 0.93 mg/dL (ref 0.61–1.24)
GFR, Estimated: >60 mL/min (ref >60)
Glucose, Bld: 119 mg/dL — ABNORMAL HIGH (ref 70–99)
Potassium: 3.6 mmol/L (ref 3.5–5.1)
Sodium: 138 mmol/L (ref 135–145)
Total Bilirubin: 0.6 mg/dL (ref 0.3–1.2)
Total Protein: 8 g/dL (ref 6.5–8.1)

## 2023-07-12 LAB — MAGNESIUM
Magnesium: 2 mg/dL (ref 1.7–2.4)
Magnesium: 1.9 mg/dL (ref 1.7–2.4)

## 2023-07-12 LAB — TSH: TSH: 0.189 u[IU]/mL — ABNORMAL LOW (ref 0.350–4.500)

## 2023-07-12 LAB — HEMOGLOBIN A1C
Hgb A1c MFr Bld: 5.5 % (ref 4.8–5.6)
Mean Plasma Glucose: 111.15 mg/dL

## 2023-07-12 LAB — ETHANOL: Alcohol, Ethyl (B): <10 mg/dL (ref <10)

## 2023-07-12 MED ORDER — METOCLOPRAMIDE HCL 5 MG/ML IJ SOLN
10.0000 mg | Freq: Once | INTRAMUSCULAR | Status: AC
  Administered 2023-07-12: 10 mg via INTRAVENOUS
  Filled 2023-07-12: qty 2

## 2023-07-12 MED ORDER — CLONIDINE HCL 0.1 MG PO TABS: 0.1000 mg | ORAL_TABLET | ORAL | Status: DC

## 2023-07-12 MED ORDER — MAGNESIUM HYDROXIDE 400 MG/5ML PO SUSP: 30.0000 mL | Freq: Every day | ORAL | Status: DC | PRN

## 2023-07-12 MED ORDER — KETOROLAC TROMETHAMINE 15 MG/ML IJ SOLN
15.0000 mg | Freq: Once | INTRAMUSCULAR | Status: AC
  Administered 2023-07-12: 15 mg via INTRAVENOUS
  Filled 2023-07-12: qty 1

## 2023-07-12 MED ORDER — DICYCLOMINE HCL 10 MG/ML IM SOLN
20.0000 mg | Freq: Once | INTRAMUSCULAR | Status: AC
  Administered 2023-07-12: 20 mg via INTRAMUSCULAR
  Filled 2023-07-12: qty 2

## 2023-07-12 MED ORDER — DICYCLOMINE HCL 20 MG PO TABS: 20.0000 mg | ORAL_TABLET | Freq: Four times a day (QID) | ORAL | Status: DC | PRN

## 2023-07-12 MED ORDER — NAPROXEN 500 MG PO TABS
500.0000 mg | ORAL_TABLET | Freq: Two times a day (BID) | ORAL | Status: DC | PRN
  Administered 2023-07-12: 500 mg via ORAL
  Filled 2023-07-12: qty 1

## 2023-07-12 MED ORDER — METHOCARBAMOL 500 MG PO TABS
500.0000 mg | ORAL_TABLET | Freq: Three times a day (TID) | ORAL | Status: DC | PRN
  Administered 2023-07-12: 500 mg via ORAL
  Filled 2023-07-12: qty 1

## 2023-07-12 MED ORDER — ACETAMINOPHEN 325 MG PO TABS: 650.0000 mg | ORAL_TABLET | Freq: Four times a day (QID) | ORAL | Status: DC | PRN

## 2023-07-12 MED ORDER — LOPERAMIDE HCL 2 MG PO CAPS: 2.0000 mg | ORAL_CAPSULE | ORAL | Status: DC | PRN

## 2023-07-12 MED ORDER — ONDANSETRON 4 MG PO TBDP: 4.0000 mg | ORAL_TABLET | Freq: Four times a day (QID) | ORAL | Status: DC | PRN

## 2023-07-12 MED ORDER — LACTATED RINGERS IV BOLUS
1000.0000 mL | Freq: Once | INTRAVENOUS | Status: AC
  Administered 2023-07-12: 1000 mL via INTRAVENOUS

## 2023-07-12 MED ORDER — ALUM & MAG HYDROXIDE-SIMETH 200-200-20 MG/5ML PO SUSP: 30.0000 mL | ORAL | Status: DC | PRN

## 2023-07-12 MED ORDER — HYDROXYZINE HCL 25 MG PO TABS: 25.0000 mg | ORAL_TABLET | Freq: Four times a day (QID) | ORAL | Status: DC | PRN

## 2023-07-12 MED ORDER — CLONIDINE HCL 0.1 MG PO TABS: 0.1000 mg | ORAL_TABLET | Freq: Every day | ORAL | Status: DC

## 2023-07-12 MED ORDER — CLONIDINE HCL 0.1 MG PO TABS
0.1000 mg | ORAL_TABLET | Freq: Four times a day (QID) | ORAL | Status: DC
  Filled 2023-07-12: qty 1

## 2023-07-12 NOTE — ED Notes (Signed)
Patient has been given medication, patient has complaint of withdrawal type pain, discomfort, chills, n/v.

## 2023-07-12 NOTE — ED Provider Notes (Signed)
Writer was notified by nursing staff that patient was having chest pain and actively withdrawing patient has been throwing up.  According to nursing staff patient has been experiencing these symptoms since the beginning of their shift which was at 1900. Writer did speak with Dr. Karene Fry MD, at [facility]: ED who will be accepting the patient.  Nursing staff to coordinate transport to the ED.

## 2023-07-12 NOTE — BH Assessment (Addendum)
Comprehensive Clinical Assessment (CCA) Note  07/12/2023 Dakota [name] [id number] DISPOSITION: [place] NP recommends patient to [facility].    The patient demonstrates the following risk factors for suicide: Chronic risk factors for suicide include: N/A. Acute risk factors for suicide include: N/A. Protective factors for this patient include: coping skills. Considering these factors, the overall suicide risk at this point appears to be low. Patient is appropriate for outpatient follow up.   Patient is a 27 year old male that presents to [facility] as a voluntary walk in brought in by his bother with patient requesting assistance with ongoing fentanyl use. Patient denies any S/I, H/I or AVH. Patient denies having any OP services at this time associated with mental health or substance abuse.   Patient states this date he is in active fentanyl withdrawal and wants to get detox. Patient reports EMS was called to his home earlier today but did not go to the emergency room. Patient reports daily use of fentanyl for the last 6 months and ongoing use for the last 2 years. Patient states he uses 10 to 15 tablets of fentanyl daily (dosages to vary from 0.2 mg to 5 mg) with last use earlier today when he reported he "took a couple pills."   Patient reports he has no mental health   history and has never been involved in any residential programs associated with ongoing SA issues. Patient reports 1 accidental overdose related to opiates/fentanyl use back in 2023. At this time patient denies suicidal/self-harm/homicidal ideations although reports ongoing depressive symptoms to include feeling hopeless, worthless and lacks motivation to complete any task..Patient reports he lives with his parents and younger brother and is currently unemployed. He reports that his one 87-year-old daughter.    Patient is alert/oriented x 4. Patient is observed to be in active withdrawals and is laying down in the waiting room complaining of  nausea. Patient speaks in a low voice that is difficult to understand. Patient's memory appears to be intact and thoughts somewhat disorganized. Patient's mood is anxious with affect congruent. Patient does not appear to be responding to internal stimuli.     Chief Complaint: No chief complaint on file.   Visit Diagnosis: Opiate abuse     CCA Screening, Triage and Referral (STR)  Patient Reported Information How did you hear about [place]? Self  What Is the Reason for Your Visit/Call Today? Patient is 27 year old male that presents as a voluntary walk in brought in by his brother with patient being observed to be in active withdrawals. Patient states he has been snorting 10 to 15 "M30s" (dosage could be anywhere from .02 to 5.1 mg per pill) daily for the last 2 months with last use prior to arrival when patient reported using, "a couple pills," although he also states that he ingested 2 Xanax bars, prior to arrival to assist with withdrawals. Patient denies any s/I, H/I or AVH. Patient renders limited history due to current AMS.  How Long Has This Been Causing You Problems? 1-6 months  What Do You Feel Would Help You the Most Today? Alcohol or Drug Use Treatment   Have You Recently Had Any Thoughts About Hurting Yourself? No  Are You Planning to Commit Suicide/Harm Yourself At This time? No   Flowsheet Row ED from 07/12/2023 in [facility] ED from 03/19/2023 in [facility] ED from 09/09/2022 in [facility] Emergency Department at [facility]  C-SSRS RISK CATEGORY No  Risk No Risk No Risk       Have you Recently Had Thoughts About Hurting Someone [name]? No  Are You Planning to Harm Someone at This Time? No  Explanation: NA   Have You Used Any Alcohol or Drugs in the Past 24 Hours? Yes  What Did You Use and How Much? Patient states he "took a couple pills of fentanyl" and "some xanax" prior to  arrival   Do You Currently Have a Therapist/Psychiatrist? No  Name of Therapist/Psychiatrist: Name of Therapist/Psychiatrist: NA   Have You Been Recently Discharged From Any Office Practice or Programs? No  Explanation of Discharge From Practice/Program: NA     CCA Screening Triage Referral Assessment Type of Contact: Face-to-Face  Telemedicine Service Delivery:   Is this Initial or Reassessment?   Date Telepsych consult ordered in [facility]:    Time Telepsych consult ordered in [facility]:    Location of Assessment: [facility] Assessment Services  Provider Location: [facility] Assessment Services   Collateral Involvement: None at this time   Does Patient Have a Automotive engineer Guardian? No  Legal Guardian Contact Information: NA  Copy of Legal Guardianship Form: -- (NA)  Legal Guardian Notified of Arrival: -- (NA)  Legal Guardian Notified of Pending Discharge: -- (NA)  If Minor and Not Living with Parent(s), Who has Custody? NA  Is CPS involved or ever been involved? Never  Is APS involved or ever been involved? Never   Patient Determined To Be At Risk for Harm To Self or Others Based on Review of Patient Reported Information or Presenting Complaint? No  Method: No Plan  Availability of Means: No access or NA  Intent: Vague intent or NA  Notification Required: No need or identified person  Additional Information for Danger to Others Potential: -- (NA)  Additional Comments for Danger to Others Potential: None noted  Are There Guns or Other Weapons in Your Home? No  Types of Guns/Weapons: NA  Are These Weapons Safely Secured?                            -- (NA)  Who Could Verify You Are Able To Have These Secured: NA  Do You Have any Outstanding Charges, Pending Court Dates, Parole/Probation? Denies  Contacted To Inform of Risk of Harm To Self or Others: Other: Comment (NA)    Does Patient Present under Involuntary Commitment? No    [place] of Residence:  [place]   Patient Currently Receiving the Following Services: Not Receiving Services   Determination of Need: Urgent (48 hours)   Options For Referral: Facility-Based Crisis     CCA Biopsychosocial Patient Reported Schizophrenia/Schizoaffective Diagnosis in Past: No   Strengths: Patient is willing to participate in treatment and is open to recovery interventions   Mental Health Symptoms Depression:   Difficulty Concentrating; Change in energy/activity; Hopelessness   Duration of Depressive symptoms:  Duration of Depressive Symptoms: Greater than two weeks   Mania:   None   Anxiety:    Difficulty concentrating; Irritability; Fatigue   Psychosis:   None   Duration of Psychotic symptoms:    Trauma:   None   Obsessions:   None   Compulsions:   None   Inattention:   None   Hyperactivity/Impulsivity:   None   Oppositional/Defiant Behaviors:   None   Emotional Irregularity:   None   Other Mood/Personality Symptoms:   To note patient reports his MH symptoms  are associated with his withdrawals    Mental Status Exam Appearance and self-care  Stature:   Average   Weight:   Average weight   Clothing:   Casual   Grooming:   Neglected   Cosmetic use:   None   Posture/gait:   Stooped   Motor activity:   Restless; Slowed; Tremor   Sensorium  Attention:   Distractible   Concentration:   Anxiety interferes; Scattered; Variable   Orientation:   X5   Recall/memory:   Normal   Affect and Mood  Affect:   Anxious; Depressed   Mood:   Depressed; Anxious   Relating  Eye contact:   Fleeting   Facial expression:   Constricted; Depressed   Attitude toward examiner:   Cooperative   Thought and Language  Speech flow:  Paucity; Slurred; Slow   Thought content:   Appropriate to Mood and Circumstances   Preoccupation:   None   Hallucinations:   None   Organization:   Coherent   [employer] of Knowledge:    Fair   Intelligence:   Average   Abstraction:   Normal   Judgement:   Poor   Reality Testing:   Realistic   Insight:   Fair   Decision Making:   Confused   Social Functioning  Social Maturity:   Responsible   Social Judgement:   Normal   Stress  Stressors:   Other (Comment) (Ongoing SA issues)   Coping Ability:   Overwhelmed   Skill Deficits:   Activities of daily living   Supports:   Family     Religion: Religion/Spirituality Are You A Religious Person?: No How Might This Affect Treatment?: NA  Leisure/Recreation: Leisure / Recreation Do You Have Hobbies?: No  Exercise/Diet: Exercise/Diet Do You Exercise?: No Have You Gained or Lost A Significant Amount of Weight in the Past Six Months?: No Do You Follow a Special Diet?: No Do You Have Any Trouble Sleeping?: No   CCA Employment/Education Employment/Work Situation: Employment / Work Situation Employment Situation: Unemployed Patient's Job has Been Impacted by Current Illness: No Has Patient ever Been in Equities trader?: No  Education: Education Is Patient Currently Attending School?: No Last Grade Completed: 12 Did You Product manager?: No Did You Have An Individualized Education Program (IIEP): No Did You Have Any Difficulty At [employer]?: No Patient's Education Has Been Impacted by Current Illness: No   CCA Family/Childhood History Family and Relationship History: Family history Marital status: Single Does patient have children?: No  Childhood History:  Childhood History By whom was/is the patient raised?: Both parents Did patient suffer any verbal/emotional/physical/sexual abuse as a child?: No Did patient suffer from severe childhood neglect?: No Has patient ever been sexually abused/assaulted/raped as an adolescent or adult?: No Was the patient ever a victim of a crime or a disaster?: No Witnessed domestic violence?: No Has patient been affected by domestic violence as an  adult?: No       CCA Substance Use Alcohol/Drug Use: Alcohol / Drug Use Pain Medications: See MAR Prescriptions: See MAR Over the Counter: See MAR History of alcohol / drug use?: Yes Longest period of sobriety (when/how long): Patient states one year in 2019 Negative Consequences of Use: Personal relationships, Financial Withdrawal Symptoms: Agitation, Tremors, Tingling, Weakness, Irritability, Fever / Chills, Cramps, Nausea / Vomiting Substance #1 Name of Substance 1: Fentanyl 1 - Age of First Use: 24 1 - Amount (size/oz): Pt states amounts vary from "10 t0 15" pills  of fentanyl a day 1 - Frequency: Daily 1 - Duration: Ongoing for the last 6 months 1 - Last Use / Amount: Pt states prior to arrival he took "a couple pills" 1 - Method of Aquiring: Illegal 1- Route of Use: Snorting and oral                       ASAM's:  Six Dimensions of Multidimensional Assessment  Dimension 1:  Acute Intoxication and/or Withdrawal Potential:   Dimension 1:  Description of individual's past and current experiences of substance use and withdrawal: Moderate withdrawals  Dimension 2:  Biomedical Conditions and Complications:   Dimension 2:  Description of patient's biomedical conditions and  complications: Some difficulty tolerating physical symptoms  Dimension 3:  Emotional, Behavioral, or Cognitive Conditions and Complications:  Dimension 3:  Description of emotional, behavioral, or cognitive conditions and complications: Persistent EBC that distract from recovery efforts  Dimension 4:  Readiness to Change:  Dimension 4:  Description of Readiness to Change criteria: Willing to engage in treatment  Dimension 5:  Relapse, Continued use, or Continued Problem Potential:  Dimension 5:  Relapse, continued use, or continued problem potential critiera description: Impaired recognition associated with the use of substances  Dimension 6:  Recovery/Living Environment:  Dimension 6:  Recovery/Iiving  environment criteria description: Has a supportive environment  ASAM Severity Score: ASAM's Severity Rating Score: 6  ASAM Recommended Level of Treatment: ASAM Recommended Level of Treatment: Level III Residential Treatment   Substance use Disorder (SUD) Substance Use Disorder (SUD)  Checklist Symptoms of Substance Use: Continued use despite having a persistent/recurrent physical/psychological problem caused/exacerbated by use, Evidence of tolerance, Persistent desire or unsuccessful efforts to cut down or control use, Recurrent use that results in a failure to fulfill major role obligations (work, school, home)  Recommendations for Services/Supports/Treatments: Recommendations for Services/Supports/Treatments Recommendations For Services/Supports/Treatments: Facility Based Crisis  Discharge Disposition:    DSM5 Diagnoses: Patient Active Problem List   Diagnosis Date Noted   Fentanyl use disorder, severe, 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     Referrals to Alternative Service(s): Referred to Alternative Service(s):   Place:   Date:   Time:    Referred to Alternative Service(s):   Place:   Date:   Time:    Referred to Alternative Service(s):   Place:   Date:   Time:    Referred to Alternative Service(s):   Place:   Date:   Time:     Alfredia Ferguson, LCAS

## 2023-07-12 NOTE — ED Provider Notes (Addendum)
[place] EMERGENCY DEPARTMENT AT [facility] Provider Note   CSN: [id number] Arrival date & time: 07/12/23  2133     History  Chief Complaint  Patient presents with   Withdrawal    Raylen Tangonan is a 27 y.o. male.  HPI Adult male presents from our affiliated behavioral center with concern for opiate withdrawal syndrome.  Patient self acknowledges using fentanyl for a long time, states that he currently feels nauseous, weak, and continues to request help with this. Per behavioral health notes patient acknowledge using fentanyl for years, multiple times daily, and presented there for request of assistance. There he was found to have nausea with vomiting, and he was sent here for evaluation.    Home Medications Prior to Admission medications   Medication Sig Start Date End Date Taking? Authorizing Provider  folic acid (FOLVITE) 1 MG tablet Take 1 tablet (1 mg total) by mouth daily. 09/11/22   Rolly Salter, MD  ketorolac (TORADOL) 10 MG tablet Take 1 tablet (10 mg total) by mouth every 6 (six) hours as needed (pain). 03/19/23   Zenia Resides, MD  Multiple Vitamin (MULTIVITAMIN WITH MINERALS) TABS tablet Take 1 tablet by mouth daily. 09/11/22   Rolly Salter, MD  thiamine (VITAMIN B-1) 100 MG tablet Take 1 tablet (100 mg total) by mouth daily. 09/11/22   Rolly Salter, MD  tiZANidine (ZANAFLEX) 4 MG tablet Take 1 tablet (4 mg total) by mouth every 8 (eight) hours as needed for muscle spasms. 03/19/23   Zenia Resides, MD      Allergies    Patient has no known allergies.    Review of Systems   Review of Systems  All other systems reviewed and are negative.   Physical Exam Updated Vital Signs BP (!) 140/87   Pulse 72   Temp 98.5 F (36.9 C) (Oral)   Resp (!) 22   SpO2 100%  Physical Exam Vitals and nursing note reviewed.  Constitutional:      General: He is not in acute distress.    Appearance: He is well-developed.     Comments: Thin adult male  resting, eyes closed, no distress, speaking clearly, stating that he feels tired, nauseous.  HENT:     Head: Normocephalic and atraumatic.  Eyes:     Conjunctiva/sclera: Conjunctivae normal.  Cardiovascular:     Rate and Rhythm: Normal rate and regular rhythm.  Pulmonary:     Effort: Pulmonary effort is normal. No respiratory distress.     Breath sounds: No stridor.  Abdominal:     General: There is no distension.     Tenderness: There is no abdominal tenderness. There is no guarding.  Skin:    General: Skin is warm and dry.  Neurological:     Mental Status: He is alert and oriented to person, place, and time.     ED Results / Procedures / Treatments   Labs (all labs ordered are listed, but only abnormal results are displayed) Labs Reviewed  BASIC METABOLIC PANEL - Abnormal; Notable for the following components:      Result Value   Glucose, Bld 122 (*)    BUN <5 (*)    All other components within normal limits  MAGNESIUM    EKG None  Radiology No results found.  Procedures Procedures    Medications Ordered in ED Medications  lactated ringers bolus 1,000 mL (1,000 mLs Intravenous New Bag/Given 07/12/23 2158)  metoCLOPramide (REGLAN) injection 10 mg (10 mg  Intravenous Given 07/12/23 2154)    ED Course/ Medical Decision Making/ A&P                                 Medical Decision Making Adult male with opiate use disorder presents with nausea, vomiting, weakness.  Patient has no additional vomiting after initial Reglan provided here, received fluid resuscitation, is hemodynamically unremarkable, labs without substantial electrolyte abnormalities, without evidence for distress, without evidence for decompensated state.  Patient appropriate to return to our behavioral health facility for ongoing efforts for his opiate withdrawal including consideration of longer-term medications.  Amount and/or Complexity of Data Reviewed External Data Reviewed: notes.    Details:  Behavioral health notes reviewed Labs: ordered. Decision-making details documented in ED Course.  Risk Prescription drug management. Decision regarding hospitalization. Diagnosis or treatment significantly limited by social determinants of health.  11:33 PM I spoke with nurse practitioner Verner Mould at our behavioral health facility to confirm prior instructions for return after medical clearance.   Final Clinical Impression(s) / ED Diagnoses Final diagnoses:  Opioid dependence with withdrawal [facility])     Gerhard Munch, MD 07/12/23 2316    Gerhard Munch, MD 07/12/23 2333

## 2023-07-12 NOTE — ED Notes (Signed)
Shavon NP states to put the patient on obs until his labs come back ,then admit him to [facility]. States that she did not put  ordereds in for obs but that it is ok to place him on obs.

## 2023-07-12 NOTE — Discharge Instructions (Addendum)
Patient to be transferred to [facility] once labs are completed

## 2023-07-12 NOTE — ED Notes (Signed)
Rn notified shavon that Dakota [name] states that the patient need to be admitted to obs while waiting on blood work. Derwood Kaplan states that he does not. States that  he can be placed on obs until blood comes back with out admission to obs.

## 2023-07-12 NOTE — Progress Notes (Signed)
   07/12/23 1537  [facility] Triage Screening (Walk-ins at [facility] only)  How Did You Hear About [place]? Self  What Is the Reason for Your Visit/Call Today? Dakota [name] ROUTINE is 27 year old male that presents as a voluntary walk in brought in by his brother with patient being observed to be in active withdrawals. Patient states he has been snorting 10 to 15 "M30s" (dosage could be anywhere from .02 to 5.1 mg per pill) daily for the last 2 months with last use prior to arrival when patient reported using, "a couple pills," although he also states that he ingested 2 Xanax bars, prior to arrival to assist with withdrawals. Patient denies any s/I, H/I or AVH. Patient renders limited history due to current AMS.  How Long Has This Been Causing You Problems? > than 6 months  Have You Recently Had Any Thoughts About Hurting Yourself? No  Are You Planning to Commit Suicide/Harm Yourself At This time? No  Have you Recently Had Thoughts About Hurting Someone [name]? No  Are You Planning To Harm Someone At This Time? No  Are you currently experiencing any auditory, visual or other hallucinations? No  Have You Used Any Alcohol or Drugs in the Past 24 Hours? Yes  How long ago did you use Drugs or Alcohol? Pt states he uses 10 to 15 tablets of fentanyl daily for the last 2 months. Pt states he ingested "a couple tablets" prior to arrival  What Did You Use and How Much? Pt states he uses 10 to 15 tablets of fentanyl daily for the last 2 months. Pt states he ingested "a couple tablets" prior to arrival  Do you have any current medical co-morbidities that require immediate attention? No  Clinician description of patient physical appearance/behavior: Patient is in active withdrawals with symptoms to include: nausea, tremors and "hurting all over"  What Do You Feel Would Help You the Most Today? Alcohol or Drug Use Treatment  If access to [facility] was not available, would you have sought care in the Emergency Department?  No  Determination of Need Routine (7 days)  Options For Referral  (To be determined)

## 2023-07-12 NOTE — ED Notes (Signed)
Provider states to hold clonidine due to blood pressure being 101/75. Will continue to monitor for safety.

## 2023-07-12 NOTE — ED Notes (Signed)
[facility] has been notified of pending request for medical transport to [facility] ED.

## 2023-07-12 NOTE — ED Notes (Signed)
Patient is now lying in bed, this nurse gave him extra blankets to keep him warm, will continue to monitor patient for safety.

## 2023-07-12 NOTE — ED Notes (Signed)
Pt on obs until blood work comes back then will go to [facility] denies SI/HI/AVH. Calm, cooperative throughout interview process. Skin assessment completed. Oriented to unit. Meal and drink offered. At currrent, pt continue to deniesSI/HI/AVH. Pt verbally contract for safety. Will monitor for safety.

## 2023-07-12 NOTE — ED Notes (Signed)
Pt stated he was having chest pain. Nurse notified.

## 2023-07-12 NOTE — ED Triage Notes (Signed)
Pt BIB EMS from [facility]. Currently withdrawing from fentanyl, reports snorting daily. A&Ox4. C/o CP and back pain.   Reports vomiting with small amounts of blood.  EMS VS 140/60, HR 66, 99% RA

## 2023-07-12 NOTE — ED Provider Notes (Signed)
Behavioral Health Urgent Care Medical Screening Exam  Patient Name: Dakota [name] MRN: [id number] Date of Evaluation: 07/12/23 Chief Complaint:   Diagnosis:  Final diagnoses:  Benzodiazepine abuse (HCC)  Opiate withdrawal (HCC)  Fentanyl use disorder, severe, dependence (HCC)  Substance induced mood disorder (HCC)    History of Present illness: Dakota [name] is a 27 y.o. male patient presented to [facility] as a walk in accompanied by his brother with complaints of opiate withdrawal  Dakota [name], 27 y.o., male patient seen face to face by this provider, chart reviewed, and consulted with Dr. Nelly Rout on 07/12/23.  On evaluation Dakota [name] reports he is an active fentanyl withdrawal and wants to get detox.  Patient reports EMS was called to his home earlier today but did not go to the emergency room.  Patient states that he has been using fentanyl several times a day for the last 2 years.  Patient states that he has no mental health history and has never detox before.  Patient reports 1 accidental overdose related to opiates/fentanyl use.  At this time patient denies suicidal/self-harm/homicidal ideations, psychosis, paranoia.  Patient reports he lives with his parents and younger brother.  He reports that his one 98-year-old daughter.  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  I am enclosing, music, lots of things." During evaluation Dakota [name] is seated in exam room bent over at the waist.  There is no noted distress.  He is alert/oriented x 4, calm, cooperative, attentive, and responses were relevant and appropriate to assessment questions.  He spoke in a clear tone at moderate volume, and normal pace, with good eye contact.   He denies suicidal/self-harm/homicidal ideation, psychosis, and paranoia.  Objectively there is no evidence of psychosis/mania or delusional thinking.  He conversed coherently, with goal directed thoughts, no distractibility, or  pre-occupation and he has denied suicidal/self-harm/homicidal ideation, psychosis, and paranoia.    Flowsheet Row ED from 07/12/2023 in [facility] ED from 03/19/2023 in [facility] ED from 09/09/2022 in [facility] Emergency Department at [facility]  C-SSRS RISK CATEGORY No Risk No Risk No Risk       Psychiatric Specialty Exam  Presentation  General Appearance:Appropriate for Environment; Casual; Disheveled  Eye Contact:Good  Speech:Clear and Coherent; Normal Rate  Speech Volume:Normal  Handedness:Right   Mood and Affect  Mood: Dysphoric  Affect: Congruent   Thought Process  Thought Processes: Coherent; Goal Directed  Descriptions of Associations:Intact  Orientation:Full (Time, Place and Person)  Thought Content:Logical    Hallucinations:None  Ideas of Reference:None  Suicidal Thoughts:No  Homicidal Thoughts:No   Sensorium  Memory: Immediate Good; Recent Good; Remote Good  Judgment: Intact  Insight: Present   Executive Functions  Concentration: Good  Attention Span: Good  Recall: Good  Fund of Knowledge: Good  Language: Good   Psychomotor Activity  Psychomotor Activity: Normal   Assets  Assets: Communication Skills; Desire for Improvement; Housing; Social Support   Sleep  Sleep: Good  Number of hours: No data recorded  Physical Exam: Physical Exam Vitals and nursing note reviewed. Chaperone present: Brother present.  Constitutional:      General: He is not in acute distress.    Appearance: Normal appearance. He is not ill-appearing.  HENT:     Head: Normocephalic.  Eyes:     Conjunctiva/sclera: Conjunctivae normal.  Cardiovascular:     Rate and Rhythm: Normal rate.  Pulmonary:     Effort:  Pulmonary effort is normal. No respiratory distress.  Musculoskeletal:        General: Normal range of motion.     Cervical back: Normal range  of motion.  Skin:    General: Skin is warm and dry.  Neurological:     Mental Status: He is alert and oriented to person, place, and time.  Psychiatric:        Attention and Perception: Attention and perception normal. He does not perceive auditory or visual hallucinations.        Mood and Affect: Mood is anxious and depressed.        Speech: Speech normal.        Behavior: Behavior normal. Behavior is cooperative.        Thought Content: Thought content normal. Thought content is not paranoid or delusional. Thought content does not include homicidal or suicidal ideation.        Cognition and Memory: Cognition and memory normal.        Judgment: Judgment normal.    Review of Systems  Constitutional:  Positive for malaise/fatigue.       No other complaints voiced  Gastrointestinal:  Positive for abdominal pain and nausea.  Musculoskeletal:  Positive for myalgias.  Psychiatric/Behavioral:  Negative for depression. Substance abuse: Daily use of Fentanyl  for 2 years. Suicidal ideas: Denies.The patient is not nervous/anxious.        Reports he wants to detox off Fentanyl   All other systems reviewed and are negative.  Blood pressure 118/85, pulse 74, temperature 99.1 F (37.3 C), resp. rate 18, SpO2 100%. There is no height or weight on file to calculate BMI.  Musculoskeletal: Strength & Muscle Tone: within normal limits Gait & Station: normal Patient leans: N/A   [facility] MSE Discharge Disposition for Follow up and Recommendations: Based on my evaluation I certify that psychiatric inpatient services furnished can reasonably be expected to improve the patient's condition which I recommend transfer to an appropriate accepting facility.   Recommend patient to be admitted to [facility] for opiate withdrawal treatment.  Lab Orders         CBC with Differential/Platelet         Comprehensive metabolic panel         Hemoglobin A1c         Magnesium         Ethanol         Lipid panel          TSH         Prolactin         Urinalysis, Routine w reflex microscopic -Urine, Clean Catch         POCT Urine Drug Screen - (I-Screen)      Meds ordered this encounter  Medications   acetaminophen (TYLENOL) tablet 650 mg   alum & mag hydroxide-simeth (MAALOX/MYLANTA) 200-200-20 MG/5ML suspension 30 mL   magnesium hydroxide (MILK OF MAGNESIA) suspension 30 mL   dicyclomine (BENTYL) tablet 20 mg   hydrOXYzine (ATARAX) tablet 25 mg   loperamide (IMODIUM) capsule 2-4 mg   methocarbamol (ROBAXIN) tablet 500 mg   naproxen (NAPROSYN) tablet 500 mg   ondansetron (ZOFRAN-ODT) disintegrating tablet 4 mg   FOLLOWED BY Linked Order Group    cloNIDine (CATAPRES) tablet 0.1 mg    cloNIDine (CATAPRES) tablet 0.1 mg    cloNIDine (CATAPRES) tablet 0.1 mg      Lailana Shira, NP 07/12/2023, 4:04 PM

## 2023-07-13 ENCOUNTER — Other Ambulatory Visit (HOSPITAL_COMMUNITY)
Admission: EM | Admit: 2023-07-13 | Discharge: 2023-07-19 | Payer: BLUE CROSS/BLUE SHIELD | Attending: Psychiatry | Admitting: Psychiatry

## 2023-07-13 ENCOUNTER — Encounter (HOSPITAL_COMMUNITY): Payer: Self-pay | Admitting: Registered Nurse

## 2023-07-13 ENCOUNTER — Ambulatory Visit (HOSPITAL_COMMUNITY)
Admission: EM | Admit: 2023-07-13 | Discharge: 2023-07-13 | Disposition: A | Discharge status: Other Acute Inpatient Hospital | Payer: BLUE CROSS/BLUE SHIELD | Attending: Psychiatry | Admitting: Psychiatry

## 2023-07-13 DIAGNOSIS — F1910 Other psychoactive substance abuse, uncomplicated: Secondary | ICD-10-CM | POA: Diagnosis not present

## 2023-07-13 DIAGNOSIS — F4310 Post-traumatic stress disorder, unspecified: Secondary | ICD-10-CM | POA: Insufficient documentation

## 2023-07-13 DIAGNOSIS — F1123 Opioid dependence with withdrawal: Secondary | ICD-10-CM

## 2023-07-13 DIAGNOSIS — F1120 Opioid dependence, uncomplicated: Secondary | ICD-10-CM | POA: Diagnosis present

## 2023-07-13 DIAGNOSIS — F1193 Opioid use, unspecified with withdrawal: Secondary | ICD-10-CM | POA: Diagnosis not present

## 2023-07-13 DIAGNOSIS — F17200 Nicotine dependence, unspecified, uncomplicated: Secondary | ICD-10-CM | POA: Insufficient documentation

## 2023-07-13 DIAGNOSIS — D649 Anemia, unspecified: Secondary | ICD-10-CM | POA: Insufficient documentation

## 2023-07-13 DIAGNOSIS — F32A Depression, unspecified: Secondary | ICD-10-CM | POA: Insufficient documentation

## 2023-07-13 DIAGNOSIS — F411 Generalized anxiety disorder: Secondary | ICD-10-CM | POA: Insufficient documentation

## 2023-07-13 DIAGNOSIS — F1124 Opioid dependence with opioid-induced mood disorder: Secondary | ICD-10-CM | POA: Insufficient documentation

## 2023-07-13 DIAGNOSIS — R4589 Other symptoms and signs involving emotional state: Secondary | ICD-10-CM | POA: Diagnosis not present

## 2023-07-13 DIAGNOSIS — Z560 Unemployment, unspecified: Secondary | ICD-10-CM | POA: Insufficient documentation

## 2023-07-13 DIAGNOSIS — F1994 Other psychoactive substance use, unspecified with psychoactive substance-induced mood disorder: Secondary | ICD-10-CM | POA: Diagnosis present

## 2023-07-13 LAB — PROLACTIN: Prolactin: 3.2 ng/mL — ABNORMAL LOW (ref 3.6–31.5)

## 2023-07-13 MED ORDER — NAPROXEN 500 MG PO TABS
500.0000 mg | ORAL_TABLET | Freq: Two times a day (BID) | ORAL | Status: AC | PRN
  Administered 2023-07-13 – 2023-07-17 (×3): 500 mg via ORAL
  Filled 2023-07-13 (×3): qty 1

## 2023-07-13 MED ORDER — PROMETHAZINE HCL 25 MG/ML IJ SOLN
25.0000 mg | Freq: Three times a day (TID) | INTRAMUSCULAR | Status: DC | PRN
  Administered 2023-07-14: 25 mg via INTRAMUSCULAR
  Filled 2023-07-13: qty 1

## 2023-07-13 MED ORDER — MAGNESIUM HYDROXIDE 400 MG/5ML PO SUSP: 30.0000 mL | Freq: Every day | ORAL | Status: DC | PRN

## 2023-07-13 MED ORDER — DICYCLOMINE HCL 20 MG PO TABS: 20.0000 mg | ORAL_TABLET | Freq: Four times a day (QID) | ORAL | Status: AC | PRN

## 2023-07-13 MED ORDER — LORAZEPAM 1 MG PO TABS: 1.0000 mg | ORAL_TABLET | ORAL | Status: DC | PRN

## 2023-07-13 MED ORDER — CLONIDINE HCL 0.1 MG PO TABS
0.1000 mg | ORAL_TABLET | Freq: Four times a day (QID) | ORAL | Status: DC
  Administered 2023-07-13: 0.1 mg via ORAL
  Filled 2023-07-13: qty 1

## 2023-07-13 MED ORDER — HYDROXYZINE HCL 25 MG PO TABS: 25.0000 mg | ORAL_TABLET | Freq: Four times a day (QID) | ORAL | Status: DC | PRN

## 2023-07-13 MED ORDER — NICOTINE 14 MG/24HR TD PT24
14.0000 mg | MEDICATED_PATCH | Freq: Every day | TRANSDERMAL | Status: DC | PRN
  Administered 2023-07-14: 14 mg via TRANSDERMAL
  Filled 2023-07-13: qty 1

## 2023-07-13 MED ORDER — ONDANSETRON 4 MG PO TBDP
4.0000 mg | ORAL_TABLET | Freq: Four times a day (QID) | ORAL | Status: DC | PRN
  Administered 2023-07-13: 4 mg via ORAL
  Filled 2023-07-13: qty 1

## 2023-07-13 MED ORDER — ACETAMINOPHEN 325 MG PO TABS: 650.0000 mg | ORAL_TABLET | Freq: Four times a day (QID) | ORAL | Status: DC | PRN

## 2023-07-13 MED ORDER — NAPROXEN 500 MG PO TABS: 500.0000 mg | ORAL_TABLET | Freq: Two times a day (BID) | ORAL | Status: DC | PRN

## 2023-07-13 MED ORDER — HYDROXYZINE HCL 25 MG PO TABS
25.0000 mg | ORAL_TABLET | Freq: Four times a day (QID) | ORAL | Status: DC | PRN
  Administered 2023-07-14 – 2023-07-17 (×5): 25 mg via ORAL
  Filled 2023-07-13 (×5): qty 1

## 2023-07-13 MED ORDER — ACETAMINOPHEN 325 MG PO TABS
650.0000 mg | ORAL_TABLET | Freq: Four times a day (QID) | ORAL | Status: DC | PRN
  Administered 2023-07-13 – 2023-07-19 (×3): 650 mg via ORAL
  Filled 2023-07-13 (×3): qty 2

## 2023-07-13 MED ORDER — CLONIDINE HCL 0.1 MG PO TABS: 0.1000 mg | ORAL_TABLET | ORAL | Status: DC

## 2023-07-13 MED ORDER — CLONIDINE HCL 0.1 MG PO TABS
0.1000 mg | ORAL_TABLET | Freq: Four times a day (QID) | ORAL | Status: AC
  Administered 2023-07-13 – 2023-07-15 (×10): 0.1 mg via ORAL
  Filled 2023-07-13 (×10): qty 1

## 2023-07-13 MED ORDER — CLONIDINE HCL 0.1 MG PO TABS: 0.1000 mg | ORAL_TABLET | Freq: Every day | ORAL | Status: DC

## 2023-07-13 MED ORDER — ALUM & MAG HYDROXIDE-SIMETH 200-200-20 MG/5ML PO SUSP: 30.0000 mL | ORAL | Status: DC | PRN

## 2023-07-13 MED ORDER — OLANZAPINE 10 MG PO TBDP: 10.0000 mg | ORAL_TABLET | Freq: Three times a day (TID) | ORAL | Status: DC | PRN

## 2023-07-13 MED ORDER — PROMETHAZINE HCL 25 MG/ML IJ SOLN
25.0000 mg | Freq: Once | INTRAMUSCULAR | Status: AC
  Administered 2023-07-13: 25 mg via INTRAMUSCULAR
  Filled 2023-07-13: qty 1

## 2023-07-13 MED ORDER — LOPERAMIDE HCL 2 MG PO CAPS: 2.0000 mg | ORAL_CAPSULE | ORAL | Status: DC | PRN

## 2023-07-13 MED ORDER — ONDANSETRON 4 MG PO TBDP: 4.0000 mg | ORAL_TABLET | Freq: Four times a day (QID) | ORAL | Status: DC | PRN

## 2023-07-13 MED ORDER — ACETAMINOPHEN 500 MG PO TABS
1000.0000 mg | ORAL_TABLET | Freq: Four times a day (QID) | ORAL | Status: DC | PRN
  Filled 2023-07-13: qty 2

## 2023-07-13 MED ORDER — CLONIDINE HCL 0.1 MG PO TABS
0.1000 mg | ORAL_TABLET | ORAL | Status: DC
  Administered 2023-07-15: 0.1 mg via ORAL
  Filled 2023-07-13 (×2): qty 1

## 2023-07-13 MED ORDER — METHOCARBAMOL 500 MG PO TABS: 500.0000 mg | ORAL_TABLET | Freq: Three times a day (TID) | ORAL | Status: DC | PRN

## 2023-07-13 MED ORDER — LOPERAMIDE HCL 2 MG PO CAPS: 2.0000 mg | ORAL_CAPSULE | ORAL | Status: AC | PRN

## 2023-07-13 MED ORDER — PROMETHAZINE HCL 25 MG/ML IJ SOLN: 25.0000 mg | Freq: Once | INTRAMUSCULAR | Status: DC

## 2023-07-13 MED ORDER — METHOCARBAMOL 500 MG PO TABS
500.0000 mg | ORAL_TABLET | Freq: Three times a day (TID) | ORAL | Status: AC | PRN
  Administered 2023-07-13 – 2023-07-14 (×2): 500 mg via ORAL
  Filled 2023-07-13 (×2): qty 1

## 2023-07-13 MED ORDER — ZIPRASIDONE MESYLATE 20 MG IM SOLR: 20.0000 mg | INTRAMUSCULAR | Status: DC | PRN

## 2023-07-13 MED ORDER — DICYCLOMINE HCL 20 MG PO TABS: 20.0000 mg | ORAL_TABLET | Freq: Four times a day (QID) | ORAL | Status: DC | PRN

## 2023-07-13 NOTE — ED Provider Notes (Signed)
FBC/OBS ASAP Discharge Summary  Date and Time: 07/13/2023 10:25 AM  Name: Dakota [name]  MRN:  [id number]   Discharge Diagnoses:  Final diagnoses:  Anxious appearance  Substance abuse (HCC)  Opioid dependence with withdrawal [facility])   Stay Summary: Dakota [name] is a 27 y.o. male patient who  presented to [facility] as a walk in accompanied by his brother with complaints of opiate withdrawal and requesting detox  Loxley Schmale reassessed face-to-face by this provider, chart reviewed, and consulted with Dr. Nelly Rout on 07/13/23 On evaluation, Dakota [name] is lying in bed with no noted distress.  He is alert, oriented x 4, and cooperative.  His speech is clear, coherent, at normal rate, and moderated volume.  He denies suicidal/self-harm/homicidal ideation, psychosis, and paranoia.  He/She reports he continues to have some nausea but no vomiting this morning.  States that he hasn't felt like eating anything, but encouraged to eat and drink plenty of liquids.   Objectively there is no evidence of psychosis/mania or delusional thinking.  His responses to assessment questions were relevant and appropriate.  He conversed coherently, with goal directed thoughts, no distractibility, or pre-occupation.  Recommended transfer to [facility] for continuation of care opioid withdrawal    Total Time spent with patient: 30 minutes  Past Psychiatric History: Polysubstance abuse, opioid use disorder, Fentanyl abuse  Past Medical History: None reported Past Medical History:  Diagnosis Date   Concussion     Family History:  Family History  Problem Relation Age of Onset   Heart disease Maternal Grandmother    Cancer Maternal Grandmother    Kidney disease Maternal Grandfather    Cancer Paternal Grandmother     Family Psychiatric History: None reported Social History: Unemployed Social History   Tobacco Use   Smoking status: Every Day    Current packs/day: 0.50    Types: Cigarettes   Smokeless  tobacco: Never  Vaping Use   Vaping status: Every Day   Substances: Nicotine, Flavoring  Substance Use Topics   Alcohol use: Yes    Comment: occasionally   Drug use: Yes    Types: Marijuana    Comment: reports last use 3 days ago     Current Medications:  Current Facility-Administered Medications  Medication Dose Route Frequency Provider Last Rate Last Admin   acetaminophen (TYLENOL) tablet 650 mg  650 mg Oral Q6H PRN Sindy Guadeloupe, NP       alum & mag hydroxide-simeth (MAALOX/MYLANTA) 200-200-20 MG/5ML suspension 30 mL  30 mL Oral Q4H PRN Sindy Guadeloupe, NP       cloNIDine (CATAPRES) tablet 0.1 mg  0.1 mg Oral QID Sindy Guadeloupe, NP   0.1 mg at 07/13/23 9562   Followed by   Melene Muller ON 07/15/2023] cloNIDine (CATAPRES) tablet 0.1 mg  0.1 mg Oral Elgie Collard, NP       Followed by   Melene Muller ON 07/17/2023] cloNIDine (CATAPRES) tablet 0.1 mg  0.1 mg Oral QAC breakfast Sindy Guadeloupe, NP       dicyclomine (BENTYL) tablet 20 mg  20 mg Oral Q6H PRN Sindy Guadeloupe, NP       hydrOXYzine (ATARAX) tablet 25 mg  25 mg Oral Q6H PRN Sindy Guadeloupe, NP       loperamide (IMODIUM) capsule 2-4 mg  2-4 mg Oral PRN Sindy Guadeloupe, NP       OLANZapine zydis (ZYPREXA) disintegrating tablet 10 mg  10 mg Oral Q8H PRN Sindy Guadeloupe, NP       And  LORazepam (ATIVAN) tablet 1 mg  1 mg Oral PRN Sindy Guadeloupe, NP       And   ziprasidone (GEODON) injection 20 mg  20 mg Intramuscular PRN Sindy Guadeloupe, NP       magnesium hydroxide (MILK OF MAGNESIA) suspension 30 mL  30 mL Oral Daily PRN Sindy Guadeloupe, NP       methocarbamol (ROBAXIN) tablet 500 mg  500 mg Oral Q8H PRN Sindy Guadeloupe, NP       naproxen (NAPROSYN) tablet 500 mg  500 mg Oral BID PRN Sindy Guadeloupe, NP       ondansetron (ZOFRAN-ODT) disintegrating tablet 4 mg  4 mg Oral Q6H PRN Sindy Guadeloupe, NP   4 mg at 07/13/23 1191   Current Outpatient Medications  Medication Sig Dispense Refill   folic acid (FOLVITE) 1 MG tablet Take 1 tablet (1 mg total) by  mouth daily. 30 tablet 0   ketorolac (TORADOL) 10 MG tablet Take 1 tablet (10 mg total) by mouth every 6 (six) hours as needed (pain). 20 tablet 0   Multiple Vitamin (MULTIVITAMIN WITH MINERALS) TABS tablet Take 1 tablet by mouth daily. 30 tablet 0   thiamine (VITAMIN B-1) 100 MG tablet Take 1 tablet (100 mg total) by mouth daily. 30 tablet 0   tiZANidine (ZANAFLEX) 4 MG tablet Take 1 tablet (4 mg total) by mouth every 8 (eight) hours as needed for muscle spasms. 15 tablet 0    PTA Medications:  Facility Ordered Medications  Medication   [COMPLETED] lactated ringers bolus 1,000 mL   acetaminophen (TYLENOL) tablet 650 mg   alum & mag hydroxide-simeth (MAALOX/MYLANTA) 200-200-20 MG/5ML suspension 30 mL   magnesium hydroxide (MILK OF MAGNESIA) suspension 30 mL   dicyclomine (BENTYL) tablet 20 mg   hydrOXYzine (ATARAX) tablet 25 mg   loperamide (IMODIUM) capsule 2-4 mg   methocarbamol (ROBAXIN) tablet 500 mg   naproxen (NAPROSYN) tablet 500 mg   ondansetron (ZOFRAN-ODT) disintegrating tablet 4 mg   cloNIDine (CATAPRES) tablet 0.1 mg   Followed by   Melene Muller ON 07/15/2023] cloNIDine (CATAPRES) tablet 0.1 mg   Followed by   Melene Muller ON 07/17/2023] cloNIDine (CATAPRES) tablet 0.1 mg   OLANZapine zydis (ZYPREXA) disintegrating tablet 10 mg   And   LORazepam (ATIVAN) tablet 1 mg   And   ziprasidone (GEODON) injection 20 mg   PTA Medications  Medication Sig   folic acid (FOLVITE) 1 MG tablet Take 1 tablet (1 mg total) by mouth daily.   Multiple Vitamin (MULTIVITAMIN WITH MINERALS) TABS tablet Take 1 tablet by mouth daily.   thiamine (VITAMIN B-1) 100 MG tablet Take 1 tablet (100 mg total) by mouth daily.   ketorolac (TORADOL) 10 MG tablet Take 1 tablet (10 mg total) by mouth every 6 (six) hours as needed (pain).   tiZANidine (ZANAFLEX) 4 MG tablet Take 1 tablet (4 mg total) by mouth every 8 (eight) hours as needed for muscle spasms.       05/27/2021    4:01 PM  Depression screen PHQ 2/9   Decreased Interest 0  Down, Depressed, Hopeless 0  PHQ - 2 Score 0    Flowsheet Row ED from 07/13/2023 in [facility] ED from 07/12/2023 in [facility] ED from 03/19/2023 in [facility] Urgent Care at [facility])  C-SSRS RISK CATEGORY No Risk No Risk No Risk       Musculoskeletal  Strength & Muscle Tone: within normal limits Gait & Station:  normal Patient leans: N/A  Psychiatric Specialty Exam  Presentation  General Appearance:  Appropriate for Environment  Eye Contact: Fair  Speech: Clear and Coherent; Normal Rate  Speech Volume: Normal  Handedness: Right   Mood and Affect  Mood: Depressed  Affect: Congruent   Thought Process  Thought Processes: Coherent; Goal Directed  Descriptions of Associations:Intact  Orientation:Full (Time, Place and Person)  Thought Content:Logical  Diagnosis of Schizophrenia or Schizoaffective disorder in past: No    Hallucinations:Hallucinations: None  Ideas of Reference:None  Suicidal Thoughts:Suicidal Thoughts: No  Homicidal Thoughts:Homicidal Thoughts: No   Sensorium  Memory: Immediate Good; Recent Good  Judgment: Intact  Insight: Present   Executive Functions  Concentration: Good  Attention Span: Good  Recall: Good  Fund of Knowledge: Good  Language: Good   Psychomotor Activity  Psychomotor Activity: Psychomotor Activity: Normal   Assets  Assets: Communication Skills; Desire for Improvement; Housing; Resilience; Social Support   Sleep  Sleep: Sleep: Fair Number of Hours of Sleep: 6   Nutritional Assessment (For OBS and FBC admissions only) Has the patient had a weight loss or gain of 10 pounds or more in the last 3 months?: No Has the patient had a decrease in food intake/or appetite?: No Does the patient have dental problems?: No Does the patient have eating habits or behaviors that may be indicators of an  eating disorder including binging or inducing vomiting?: No Has the patient recently lost weight without trying?: 0 Has the patient been eating poorly because of a decreased appetite?: 0 Malnutrition Screening Tool Score: 0    Physical Exam  Physical Exam Vitals and nursing note reviewed.  Constitutional:      General: He is not in acute distress.    Appearance: Normal appearance. He is not ill-appearing.  HENT:     Head: Normocephalic.  Cardiovascular:     Rate and Rhythm: Normal rate.  Pulmonary:     Effort: Pulmonary effort is normal. No respiratory distress.  Musculoskeletal:        General: Normal range of motion.     Cervical back: Normal range of motion.  Skin:    General: Skin is warm and dry.  Neurological:     Mental Status: He is alert and oriented to person, place, and time.  Psychiatric:        Attention and Perception: Attention and perception normal. He does not perceive auditory or visual hallucinations.        Mood and Affect: Mood and affect normal.        Speech: Speech normal.        Behavior: Behavior normal. Behavior is cooperative.        Thought Content: Thought content normal. Thought content is not paranoid or delusional. Thought content does not include homicidal or suicidal ideation.        Cognition and Memory: Cognition and memory normal.        Judgment: Judgment normal.    Review of Systems  Constitutional:  Positive for malaise/fatigue.       No other complaints voice  Cardiovascular:  Negative for chest pain (Denies).  Gastrointestinal:  Positive for nausea and vomiting.  Psychiatric/Behavioral:  Positive for substance abuse. Depression: Stable. Hallucinations: Denies. Suicidal ideas: Denies.Nervous/anxious: Stable.   All other systems reviewed and are negative.  Blood pressure 134/89, pulse 71, temperature 98.8 F (37.1 C), temperature source Oral, resp. rate 18, SpO2 100%. There is no height or weight on file to calculate  BMI.  Disposition:  Transfer to [facility] for continuation of care.    Sheritha Louis, NP 07/13/2023, 10:25 AM

## 2023-07-13 NOTE — Group Note (Signed)
Group Topic: Healthy Self Image and Positive Change  Group Date: 07/13/2023 Start Time: 2000 End Time: 2030 Facilitators: Guss Bunde  Department: [facility]  Number of Participants: 5  Group Focus: affirmation Treatment Modality:  Psychoeducation Interventions utilized were group exercise Purpose: reinforce self-care  Name: Dakota [name] Date of Birth: 09/01/1996  MR: [id number]    Level of Participation: Did not attend group Quality of Participation:  Interactions with others:  Mood/Affect:  Triggers (if applicable):  Cognition:  Progress:  Response:  Plan: Pt was not felling well from detox  Patients Problems:  Patient Active Problem List   Diagnosis Date Noted   Opioid dependence with withdrawal (HCC) 07/13/2023   Fentanyl use disorder, severe, 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

## 2023-07-13 NOTE — ED Notes (Signed)
Patient  sleeping in no acute stress. RR even and unlabored .Environment secured .Will continue to monitor for safely. 

## 2023-07-13 NOTE — ED Notes (Addendum)
Notified provider that patient was vomiting and nauseous and that he was having trembles.Notified provider that patient vomited . Still reports nausea.

## 2023-07-13 NOTE — ED Notes (Addendum)
Pt received Clonidine. Tolerated well. Drank 16 oz of Gatorade. Tolerated well. Encouraged to continue hydration.

## 2023-07-13 NOTE — ED Provider Notes (Cosign Needed Addendum)
Facility Based Crisis Admission H&P  Date and Time: 07/13/2023 4:16 PM Name: Dakota [name] MRN:  [id number]  Subjective:  Patient is a 27 y.o. male with a history of substance use disorder voluntary from [facility] to [facility] for assistance in detox from fentanyl.  HPI:  This morning patient was laying down in bed and curled under blankets pulled over his head. He does not face examiner or make any eye contact. Patient states he does not feel well, provides minimal answers and is withdrawn. Patient had been vomiting and nauseous during the morning.Patient Received oral hydration supplement and encouraged to hydrate.  Patient reports using fentanyl and percocets "about 15 little blue pills" that he sniffs, and he states his last use was about 2 days ago. Denies IV substance use, heroin, meth, alcohol.  Patient states he is in the hospital because "I needed it."   Collateral information provided by , pt's mother -- no answer 10/01 Collateral information provided by Fritzi Mandes Brother reports pt woke him up brother and was withdrawing and complaining of stomach pain, body ache. Brother recently learned patient had recurrent substance use after being sober for a while and recently run out of fentanyl and percocets. Currently 10-15 pills a day, Blue 30s.  Brother reports last use was not sure, maybe 3 days ago. Reports patient has history of also using adderall, xanax.  Brother interested in setting up residential facility for rehab    Past Psychiatric Hx: Obtained from patient's brother: denies previous history of psychiatric hospitalizations or rehabilitation. Does not have outpatient follow-up.   Substance Abuse Hx: Alcohol: denies Tobacco: 0.5 pack per day Cannabis: denies Other Illicit drugs: Fentanyl. Denies heroin, meth Rx drug abuse: Percocet, adderall Rehab hx: None   Past Medical History: Not assessed due to patient feeling ill. Allergies: Per chart review, none Hospitalizations  per chart review:  - Hospitalized in 2023: fentanyl and percocet overdose, AKI - Hospitalized in 2014: altered mental status and possible seizure activity, UDS positive cannabinoids. Dx as likely a functional disorder with a possible underlying viral syndrome. Normal head CT, CSF study - previously diagnosed with conversion disorder at 27 y.o. Seizures: Seizure like activity documented in 2014 and 2020     Family Medical History: Not assessed due to patient feeling ill.   Family Psychiatric History: Not assessed due to patient feeling ill. Psychiatric Dx: per chart review, paternal aunt with bipolar II    Social History: Living Situation: Currently lives with brother and parents Education: not assessed Occupational hx: Per brother, currently unemployed for about 6 months Marital Status: not assessed Children: 5 y.o. daughter Legal: Per brother, none Military: not assessed Access to firearms: Per brother, none  Diagnosis:  Final diagnoses:  None    Total Time spent with patient: 1.5 hours  Current Medications:  Current Facility-Administered Medications  Medication Dose Route Frequency Provider Last Rate Last Admin   acetaminophen (TYLENOL) tablet 650 mg  650 mg Oral Q6H PRN Rankin, Shuvon B, NP       alum & mag hydroxide-simeth (MAALOX/MYLANTA) 200-200-20 MG/5ML suspension 30 mL  30 mL Oral Q4H PRN Rankin, Shuvon B, NP       cloNIDine (CATAPRES) tablet 0.1 mg  0.1 mg Oral QID Rankin, Shuvon B, NP   0.1 mg at 07/13/23 1435   Followed by   Melene Muller ON 07/15/2023] cloNIDine (CATAPRES) tablet 0.1 mg  0.1 mg Oral BH-qamhs Rankin, Shuvon B, NP       Followed by   Melene Muller ON  07/18/2023] cloNIDine (CATAPRES) tablet 0.1 mg  0.1 mg Oral QAC breakfast Rankin, Shuvon B, NP       dicyclomine (BENTYL) tablet 20 mg  20 mg Oral Q6H PRN Rankin, Shuvon B, NP       hydrOXYzine (ATARAX) tablet 25 mg  25 mg Oral Q6H PRN Rankin, Shuvon B, NP       loperamide (IMODIUM) capsule 2-4 mg  2-4 mg Oral PRN  Rankin, Shuvon B, NP       magnesium hydroxide (MILK OF MAGNESIA) suspension 30 mL  30 mL Oral Daily PRN Rankin, Shuvon B, NP       methocarbamol (ROBAXIN) tablet 500 mg  500 mg Oral Q8H PRN Rankin, Shuvon B, NP       naproxen (NAPROSYN) tablet 500 mg  500 mg Oral BID PRN Rankin, Shuvon B, NP       nicotine (NICODERM CQ - dosed in mg/24 hours) patch 14 mg  14 mg Transdermal Daily PRN Carrion-Carrero, Lindy Pennisi, MD       promethazine (PHENERGAN) injection 25 mg  25 mg Intramuscular Q8H PRN Carrion-Carrero, Carlous Olivares, MD       No current outpatient medications on file.    Labs  Lab Results:  Admission on 07/12/2023, Discharged on 07/13/2023  Component Date Value Ref Range Status   Sodium 07/12/2023 141  135 - 145 mmol/L Final   Potassium 07/12/2023 3.7  3.5 - 5.1 mmol/L Final   Chloride 07/12/2023 106  98 - 111 mmol/L Final   CO2 07/12/2023 24  22 - 32 mmol/L Final   Glucose, Bld 07/12/2023 122 (H)  70 - 99 mg/dL Final   Glucose reference range applies only to samples taken after fasting for at least 8 hours.   BUN 07/12/2023 <5 (L)  6 - 20 mg/dL Final   Creatinine, Ser 07/12/2023 1.05  0.61 - 1.24 mg/dL Final   Calcium 16/07/9603 9.5  8.9 - 10.3 mg/dL Final   GFR, Estimated 07/12/2023 >60  >60 mL/min Final   Comment: (NOTE) Calculated using the CKD-EPI Creatinine Equation (2021)    Anion gap 07/12/2023 11  5 - 15 Final   Performed at [facility], [address], [place], [place] [address]   Magnesium 07/12/2023 1.9  1.7 - 2.4 mg/dL Final   Performed at [facility], [address], [place], [place] [address]  Admission on 07/12/2023, Discharged on 07/12/2023  Component Date Value Ref Range Status   WBC 07/12/2023 7.8  4.0 - 10.5 K/uL Final   RBC 07/12/2023 4.50  4.22 - 5.81 MIL/uL Final   Hemoglobin 07/12/2023 11.5 (L)  13.0 - 17.0 g/dL Final   HCT 78/29/5621 35.3 (L)  39.0 - 52.0 % Final   MCV 07/12/2023 78.4 (L)  80.0 - 100.0 fL Final   MCH 07/12/2023 25.6 (L)  26.0 -  34.0 pg Final   MCHC 07/12/2023 32.6  30.0 - 36.0 g/dL Final   RDW 30/86/5784 14.0  11.5 - 15.5 % Final   Platelets 07/12/2023 405 (H)  150 - 400 K/uL Final   nRBC 07/12/2023 0.0  0.0 - 0.2 % Final   Neutrophils Relative % 07/12/2023 75  % Final   Neutro Abs 07/12/2023 5.9  1.7 - 7.7 K/uL Final   Lymphocytes Relative 07/12/2023 18  % Final   Lymphs Abs 07/12/2023 1.4  0.7 - 4.0 K/uL Final   Monocytes Relative 07/12/2023 4  % Final   Monocytes Absolute 07/12/2023 0.3  0.1 - 1.0 K/uL  Final   Eosinophils Relative 07/12/2023 2  % Final   Eosinophils Absolute 07/12/2023 0.1  0.0 - 0.5 K/uL Final   Basophils Relative 07/12/2023 1  % Final   Basophils Absolute 07/12/2023 0.0  0.0 - 0.1 K/uL Final   Immature Granulocytes 07/12/2023 0  % Final   Abs Immature Granulocytes 07/12/2023 0.03  0.00 - 0.07 K/uL Final   Performed at [facility], [address], [place], [place] [address]   Sodium 07/12/2023 138  135 - 145 mmol/L Final   Potassium 07/12/2023 3.6  3.5 - 5.1 mmol/L Final   Chloride 07/12/2023 101  98 - 111 mmol/L Final   CO2 07/12/2023 25  22 - 32 mmol/L Final   Glucose, Bld 07/12/2023 119 (H)  70 - 99 mg/dL Final   Glucose reference range applies only to samples taken after fasting for at least 8 hours.   BUN 07/12/2023 <5 (L)  6 - 20 mg/dL Final   Creatinine, Ser 07/12/2023 0.93  0.61 - 1.24 mg/dL Final   Calcium 60/45/4098 9.6  8.9 - 10.3 mg/dL Final   Total Protein 11/91/4782 8.0  6.5 - 8.1 g/dL Final   Albumin 95/62/1308 4.4  3.5 - 5.0 g/dL Final   AST 65/78/4696 24  15 - 41 U/L Final   ALT 07/12/2023 20  0 - 44 U/L Final   Alkaline Phosphatase 07/12/2023 60  38 - 126 U/L Final   Total Bilirubin 07/12/2023 0.6  0.3 - 1.2 mg/dL Final   GFR, Estimated 07/12/2023 >60  >60 mL/min Final   Comment: (NOTE) Calculated using the CKD-EPI Creatinine Equation (2021)    Anion gap 07/12/2023 12  5 - 15 Final   Performed at [facility], [address], [place], [place]  [address]   Hgb A1c MFr Bld 07/12/2023 5.5  4.8 - 5.6 % Final   Comment: (NOTE) Pre diabetes:          5.7%-6.4%  Diabetes:              >6.4%  Glycemic control for   <7.0% adults with diabetes    Mean Plasma Glucose 07/12/2023 111.15  mg/dL Final   Performed at [facility] Lab, [address], [place], [place] [address]   Magnesium 07/12/2023 2.0  1.7 - 2.4 mg/dL Final   Performed at [facility] Lab, [address] [place], [place] [address]   Alcohol, Ethyl (B) 07/12/2023 <10  <10 mg/dL Final   Comment: (NOTE) Lowest detectable limit for serum alcohol is 10 mg/dL.  For medical purposes only. Performed at [facility] Lab, [address] [place], [place] [address]    Prolactin 07/12/2023 3.2 (L)  3.6 - 31.5 ng/mL Final   Comment: (NOTE) Performed At: [facility] [address] [place], [place] [id number] Jolene Schimke MD VZ:[id number]    Color, Urine 07/12/2023 YELLOW  YELLOW Final   APPearance 07/12/2023 CLEAR  CLEAR Final   Specific Gravity, Urine 07/12/2023 1.009  1.005 - 1.030 Final   pH 07/12/2023 8.0  5.0 - 8.0 Final   Glucose, UA 07/12/2023 NEGATIVE  NEGATIVE mg/dL Final   Hgb urine dipstick 07/12/2023 NEGATIVE  NEGATIVE Final   Bilirubin Urine 07/12/2023 NEGATIVE  NEGATIVE Final   Ketones, ur 07/12/2023 NEGATIVE  NEGATIVE mg/dL Final   Protein, ur 29/51/8841 NEGATIVE  NEGATIVE mg/dL Final   Nitrite 66/03/3015 NEGATIVE  NEGATIVE Final   Leukocytes,Ua 07/12/2023 NEGATIVE  NEGATIVE Final   Performed at [facility] Lab, [address], [place], [place] [address]   POC Amphetamine UR 07/12/2023 None Detected  NONE DETECTED (Cut Off Level 1000 ng/mL) Final   POC Secobarbital (BAR) 07/12/2023 None Detected  NONE DETECTED (Cut Off Level 300 ng/mL) Final   POC Buprenorphine (BUP) 07/12/2023 None Detected  NONE DETECTED (Cut Off Level 10 ng/mL) Final   POC Oxazepam (BZO) 07/12/2023 Positive (A)  NONE DETECTED (Cut Off Level 300 ng/mL) Final   POC Cocaine  UR 07/12/2023 None Detected  NONE DETECTED (Cut Off Level 300 ng/mL) Final   POC Methamphetamine UR 07/12/2023 None Detected  NONE DETECTED (Cut Off Level 1000 ng/mL) Final   POC Morphine 07/12/2023 Positive (A)  NONE DETECTED (Cut Off Level 300 ng/mL) Final   POC Methadone UR 07/12/2023 None Detected  NONE DETECTED (Cut Off Level 300 ng/mL) Final   POC Oxycodone UR 07/12/2023 Positive (A)  NONE DETECTED (Cut Off Level 100 ng/mL) Final   POC Marijuana UR 07/12/2023 Positive (A)  NONE DETECTED (Cut Off Level 50 ng/mL) Final   Cholesterol 07/12/2023 142  0 - 200 mg/dL Final   Triglycerides 08/65/7846 57  <150 mg/dL Final   HDL 96/29/5284 56  >40 mg/dL Final   Total CHOL/HDL Ratio 07/12/2023 2.5  RATIO Final   VLDL 07/12/2023 11  0 - 40 mg/dL Final   LDL Cholesterol 07/12/2023 75  0 - 99 mg/dL Final   Comment:        Total Cholesterol/HDL:CHD Risk Coronary Heart Disease Risk Table                     Men   Women  1/2 Average Risk   3.4   3.3  Average Risk       5.0   4.4  2 X Average Risk   9.6   7.1  3 X Average Risk  23.4   11.0        Use the calculated Patient Ratio above and the CHD Risk Table to determine the patient's CHD Risk.        ATP III CLASSIFICATION (LDL):  <100     mg/dL   Optimal  132-440  mg/dL   Near or Above                    Optimal  130-159  mg/dL   Borderline  102-725  mg/dL   High  >366     mg/dL   Very High Performed at [facility] Lab, [address], [place], [place] [address]    TSH 07/12/2023 0.189 (L)  0.350 - 4.500 uIU/mL Final   Comment: Performed by a 3rd Generation assay with a functional sensitivity of <=0.01 uIU/mL. Performed at [facility], [address], [place], [place] [address]     Blood Alcohol level:  Lab Results  Component Value Date   [facility] <10 07/12/2023   ETH <10 09/09/2022    Metabolic Disorder Labs: Lab Results  Component Value Date   HGBA1C 5.5 07/12/2023   MPG 111.15 07/12/2023   Lab Results  Component  Value Date   PROLACTIN 3.2 (L) 07/12/2023   Lab Results  Component Value Date   CHOL 142 07/12/2023   TRIG 57 07/12/2023   HDL 56 07/12/2023   CHOLHDL 2.5 07/12/2023   VLDL 11 07/12/2023   LDLCALC 75 07/12/2023   LDLCALC 73 05/27/2021    Therapeutic Lab Levels: No results found for: "LITHIUM" No results found for: "  VALPROATE" No results found for: "CBMZ"  Physical Findings   PHQ2-9    Flowsheet Row Office Visit from 05/27/2021 in [facility] [place]  PHQ-2 Total Score 0      Flowsheet Row ED from 07/13/2023 in [facility] Most recent reading at 07/13/2023 12:37 PM ED from 07/13/2023 in [facility] Most recent reading at 07/13/2023  2:25 AM ED from 07/12/2023 in [facility] Most recent reading at 07/12/2023  5:38 PM  C-SSRS RISK CATEGORY No Risk No Risk No Risk        Musculoskeletal  Strength & Muscle Tone: Not assessed due to patient feeling ill. Gait & Station: Not assessed due to patient feeling ill. Patient leans: Not assessed due to patient feeling ill.  Psychiatric Specialty Exam  Presentation  General Appearance:  -- (unable to assess due to patient feeling ill, curled under covers)  Eye Contact: -- (unable to assess due to patient feeling ill, under covers)  Speech: Normal Rate  Speech Volume: Decreased  Handedness: Right   Mood and Affect  Mood: -- (unable to assess due to patient feeling ill)  Affect: -- (unable to assess due to patient feeling ill)   Thought Process  Thought Processes: -- (unable to assess due to patient feeling ill)  Descriptions of Associations:-- (unable to assess due to patient feeling ill)  Orientation:-- (unable to assess due to patient feeling ill)  Thought Content:-- (unable to assess due to patient feeling ill)  Diagnosis of Schizophrenia or Schizoaffective disorder in past: No     Hallucinations:Hallucinations: -- (unable to assess due to patient feeling ill)  Ideas of Reference:-- (unable to assess due to patient feeling ill)  Suicidal Thoughts:Suicidal Thoughts: -- (unable to assess due to patient feeling ill)  Homicidal Thoughts:Homicidal Thoughts: -- (unable to assess due to patient feeling ill)   Sensorium  Memory: -- (unable to assess due to patient feeling ill)  Judgment: -- (unable to assess due to patient feeling ill, impaired as evidenced by repeated requests to leave)  Insight: -- (unable to assess due to patient feeling ill)   Executive Functions  Concentration: -- (unable to assess due to patient feeling ill)  Attention Span: -- (unable to assess due to patient feeling ill)  Recall: -- (unable to assess due to patient feeling ill)  Fund of Knowledge: -- (unable to assess due to patient feeling ill)  Language: -- (unable to assess due to patient feeling ill)   Psychomotor Activity  Psychomotor Activity: Psychomotor Activity: Decreased   Assets  Assets: Housing; Social Support   Sleep  Sleep: Sleep: -- (unable to formally assess) Number of Hours of Sleep: 6   Nutritional Assessment (For OBS and FBC admissions only) Has the patient had a weight loss or gain of 10 pounds or more in the last 3 months?: No Has the patient had a decrease in food intake/or appetite?: No Does the patient have dental problems?: No Does the patient have eating habits or behaviors that may be indicators of an eating disorder including binging or inducing vomiting?: No Has the patient recently lost weight without trying?: 0 Has the patient been eating poorly because of a decreased appetite?: 0 Malnutrition Screening Tool Score: 0    Physical Exam  Physical Exam Vitals reviewed.  Constitutional:      General: He is not in acute distress.    Appearance: Normal appearance. He is not ill-appearing or toxic-appearing.  HENT:  Head:  Normocephalic and atraumatic.  Pulmonary:     Effort: Pulmonary effort is normal.  Neurological:     General: No focal deficit present.     Mental Status: He is alert.     Gait: Gait normal.    Review of Systems  Constitutional:  Positive for chills and malaise/fatigue.  Gastrointestinal:  Positive for diarrhea, nausea and vomiting.   Blood pressure 124/69, pulse 65, temperature 98.8 F (37.1 C), temperature source Oral, resp. rate 18, SpO2 100%. There is no height or weight on file to calculate BMI.  Treatment Plan Summary: Daily contact with patient to assess and evaluate symptoms and progress in treatment and Medication management.  Interaction with patient today was limited due to ongoing withdrawal symptoms, appears lethargic and withdrawn to his bed.  Per collateral call with patient's brother, who brought patient to [facility], plan is to transition patient to residential rehabilitation.   Opioid use disorder - Per detox protocol -Clonidine taper -Receive one-time dose of Phenergan injection 25 mg for nausea -PRNs: Tylenol 650 mg every 6 hours as needed Maalox Mylanta every 4 hours as needed Bentyl every 6 hours as needed Atarax 25 mg every 6 hours as needed Imodium as needed for diarrhea Milk of Magnesia daily as needed Robaxin 500 mg every 8 hours as needed Naproxen 500 mg twice daily as needed Phenergan injection 25 mg every 8 hours as needed  Nicotine use disorder Encourage smoking cessation Nicotine patch 14 mg daily as needed   Labs/Imaging Reviewed: CBC showing microcytic anemia (hemoglobin 11.5, MCV 78.4), platelets 405 K/uL; CMP showing some hyperglycemia glucose 119, otherwise unremarkable; A1c 5.5%; ethanol negative; magnesium WNL; prolactin 3.2; lipid panel unremarkable; TSH 0.189; UA unremarkable; UDS positive for BZO, morphine, oxycodone, THC;   EKG on 9/30: QTc 442   Dispo: TBD, likely residential rehabilitation.   Matela, Mikaela A, Medical Student    I personally was present and performed or re-performed the history, physical exam and medical decision-making activities of this service and have verified that the service and findings are accurately documented in the student's note.   Dr. Liston Alba, MD PGY-2, Psychiatry Residency

## 2023-07-13 NOTE — Group Note (Signed)
Group Topic: Communication  Group Date: 07/13/2023 Start Time: 1530 End Time: 1620 Facilitators: Prentice Docker, RN; Ward, Jacklynn Barnacle, RN  Department: [facility]  Number of Participants: 2  Group Focus: communication Treatment Modality:  Patient-Centered Therapy Interventions utilized were other Pt refused to attend group Purpose: pt refused to attend group  Name: Dakota [name] Date of Birth: 02/13/1996  MR: [id number]    Level of Participation: pt refused to attend Quality of Participation: refused Interactions with others: refused to attend Mood/Affect: active withdrawal Triggers (if applicable): n/a Cognition: n/a Progress: None Response: pt refused to attend Plan: patient will be encouraged to attend groups    Patients Problems:  Patient Active Problem List   Diagnosis Date Noted   Opioid dependence with withdrawal (HCC) 07/13/2023   Fentanyl use disorder, severe, 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

## 2023-07-13 NOTE — ED Notes (Signed)
Patient requested medication for nausea.

## 2023-07-13 NOTE — ED Notes (Signed)
Approached pt room, pt laying in bed with eyes closed, easily aroused to noise. Writer asked if he was hungry or thirsty. Pt declined. Encouraged pt to drink more fluids to prevent dehydration. Pt verbalized understanding. Pt states, "I'm cold". Writer covered pt with blanket. Pt pleasant and appreciative. Pt denies SI/HI/AVH. Pt reports feeling better since receiving Phenergan injection earlier. Praise and support provided. Encouraged pt to notify staff with  any needs or concerns. Pt verbalized understanding. Informed pt to call out for help with mobility as medication can cause sedation and unsteadiness with ambulation. Pt verbalized agreement. Will continue to monitor for safety.

## 2023-07-13 NOTE — Discharge Instructions (Signed)
Transfer to [facility]

## 2023-07-13 NOTE — ED Notes (Signed)
Pt. was given, gatorade and saltine crackers.

## 2023-07-13 NOTE — ED Notes (Signed)
Patient asked this nurse if he could go home. I advised patient that he will be here for the remainder of the night and will be reassessed by a provider this morning.

## 2023-07-13 NOTE — ED Provider Notes (Signed)
[facility]gent [facility] Continuous Assessment Admission H&P  Date: 07/13/23 Patient Name: Dakota [name] MRN: [id number] Chief Complaint: opioid withdrawal  Diagnoses:  Final diagnoses:  Opioid use with withdrawal (HCC)  Anxious appearance  Substance abuse (HCC)    HPI: Dakota [name], 27 y/o male with a history of opioid use disorder with withdrawals, polysubstance use disorder (fentanyl), return to [facility] from [facility] after he was sent for medical clearance due to increased nausea and vomiting, chest pain and withdrawal.  Please see admission notes: Dakota [name], 27 y.o., male patient seen face to face by this provider, chart reviewed, and consulted with Dr. Nelly Rout on 07/12/23.  On evaluation Dakota [name] reports he is an active fentanyl withdrawal and wants to get detox.  Patient reports EMS was called to his home earlier today but did not go to the emergency room.  Patient states that he has been using fentanyl several times a day for the last 2 years.  Patient states that he has no mental health history and has never detox before.  Patient reports 1 accidental overdose related to opiates/fentanyl use.  At this time patient denies suicidal/self-harm/homicidal ideations, psychosis, paranoia.  Patient reports he lives with his parents and younger brother.  He reports that his one 104-year-old daughter.  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  I am enclosing, music, lots of things." During evaluation Dakota [name] is seated in exam room bent over at the waist.  There is no noted distress.  He is alert/oriented x 4, calm, cooperative, attentive, and responses were relevant and appropriate to assessment questions.  He spoke in a clear tone at moderate volume, and normal pace, with good eye contact.   He denies suicidal/self-harm/homicidal ideation, psychosis, and paranoia.  Objectively there is no evidence of psychosis/mania or delusional thinking.  He conversed coherently, with  goal directed thoughts, no distractibility, or pre-occupation and he has denied suicidal/self-harm/homicidal ideation, psychosis, and paranoia.   Per the patient he does feel just a little bit better prior to going to the ED.  Patient has returning we will continue plan of treatment.  Patient denies SI, HI, AVH or paranoia.  Patient is alert and oriented x 4 is a little bit sleepy however answer questions appropriately.  A review of patient records show that patient is an active fentanyl user.  And according to the records patient has been using fentanyl for a couple of days for the past 2 years.  Patient does have 1 accidental overdose as it relates to opioid use disorder.  at this time patient does not seem to be influenced by external or internal stimuli  Recommend inpatient's with further assessment for [facility] when a bed becomes available.  Total Time spent with patient: 15 minutes  Musculoskeletal  Strength & Muscle Tone: within normal limits Gait & Station: normal Patient leans: N/A  Psychiatric Specialty Exam  Presentation General Appearance:  Casual  Eye Contact: Fair  Speech: Clear and Coherent  Speech Volume: Normal  Handedness: Right   Mood and Affect  Mood: Anxious  Affect: Congruent   Thought Process  Thought Processes: Coherent  Descriptions of Associations:Intact  Orientation:Full (Time, Place and Person)  Thought Content:Logical  Diagnosis of Schizophrenia or Schizoaffective disorder in past: No   Hallucinations:Hallucinations: None  Ideas of Reference:None  Suicidal Thoughts:Suicidal Thoughts: No  Homicidal Thoughts:Homicidal Thoughts: No   Sensorium  Memory: Immediate Fair  Judgment: Poor  Insight: Present   Executive Functions  Concentration: Good  Attention Span: Good  Recall: Dudley Major of Knowledge: Good  Language: Good   Psychomotor Activity  Psychomotor Activity: Psychomotor Activity: Normal   Assets   Assets: Desire for Improvement; Resilience   Sleep  Sleep: Sleep: Fair Number of Hours of Sleep: 6   Nutritional Assessment (For OBS and [facility] admissions only) Has the patient had a weight loss or gain of 10 pounds or more in the last 3 months?: No Has the patient had a decrease in food intake/or appetite?: No Does the patient have dental problems?: No Does the patient have eating habits or behaviors that may be indicators of an eating disorder including binging or inducing vomiting?: No Has the patient recently lost weight without trying?: 0 Has the patient been eating poorly because of a decreased appetite?: 0 Malnutrition Screening Tool Score: 0    Physical Exam HENT:     Head: Normocephalic.     Nose: Nose normal.  Eyes:     Pupils: Pupils are equal, round, and reactive to light.  Cardiovascular:     Rate and Rhythm: Normal rate.  Pulmonary:     Effort: Pulmonary effort is normal.  Musculoskeletal:        General: Normal range of motion.     Cervical back: Normal range of motion.  Neurological:     General: No focal deficit present.     Mental Status: He is alert.  Psychiatric:        Mood and Affect: Mood normal.        Behavior: Behavior normal.        Thought Content: Thought content normal.        Judgment: Judgment normal.    Review of Systems  Constitutional: Negative.   HENT: Negative.    Eyes: Negative.   Respiratory: Negative.    Cardiovascular: Negative.   Gastrointestinal: Negative.   Genitourinary: Negative.   Musculoskeletal: Negative.   Skin: Negative.   Neurological: Negative.   Psychiatric/Behavioral:  Positive for substance abuse. The patient is nervous/anxious.     Blood pressure 133/77, pulse (!) 58, temperature 98.7 F (37.1 C), temperature source Oral, resp. rate 18, SpO2 100%. There is no height or weight on file to calculate BMI.  Past Psychiatric History: Opioid use disorder, fentanyl disorder  Is the patient at risk to self?  No  Has the patient been a risk to self in the past 6 months? No .    Has the patient been a risk to self within the distant past? No   Is the patient a risk to others? No   Has the patient been a risk to others in the past 6 months? No   Has the patient been a risk to others within the distant past? No   Past Medical History: See chart  Family History: Unknown  Social History: Fentanyl abuse opioid abuse  Last Labs:  Admission on 07/12/2023, Discharged on 07/13/2023  Component Date Value Ref Range Status   Sodium 07/12/2023 141  135 - 145 mmol/L Final   Potassium 07/12/2023 3.7  3.5 - 5.1 mmol/L Final   Chloride 07/12/2023 106  98 - 111 mmol/L Final   CO2 07/12/2023 24  22 - 32 mmol/L Final   Glucose, Bld 07/12/2023 122 (H)  70 - 99 mg/dL Final   Glucose reference range applies only to samples taken after fasting for at least 8 hours.   BUN 07/12/2023 <5 (L)  6 - 20 mg/dL Final   Creatinine, Ser 07/12/2023 1.05  0.61 -  1.24 mg/dL Final   Calcium 96/29/5284 9.5  8.9 - 10.3 mg/dL Final   GFR, Estimated 07/12/2023 >60  >60 mL/min Final   Comment: (NOTE) Calculated using the CKD-EPI Creatinine Equation (2021)    Anion gap 07/12/2023 11  5 - 15 Final   Performed at [facility], [address], [place], [place] [address]   Magnesium 07/12/2023 1.9  1.7 - 2.4 mg/dL Final   Performed at [facility], [address] [place], [place] [address]  Admission on 07/12/2023, Discharged on 07/12/2023  Component Date Value Ref Range Status   WBC 07/12/2023 7.8  4.0 - 10.5 K/uL Final   RBC 07/12/2023 4.50  4.22 - 5.81 MIL/uL Final   Hemoglobin 07/12/2023 11.5 (L)  13.0 - 17.0 g/dL Final   HCT 25/36/6440 35.3 (L)  39.0 - 52.0 % Final   MCV 07/12/2023 78.4 (L)  80.0 - 100.0 fL Final   MCH 07/12/2023 25.6 (L)  26.0 - 34.0 pg Final   MCHC 07/12/2023 32.6  30.0 - 36.0 g/dL Final   RDW 34/74/2595 14.0  11.5 - 15.5 % Final   Platelets 07/12/2023 405 (H)  150 - 400 K/uL Final   nRBC  07/12/2023 0.0  0.0 - 0.2 % Final   Neutrophils Relative % 07/12/2023 75  % Final   Neutro Abs 07/12/2023 5.9  1.7 - 7.7 K/uL Final   Lymphocytes Relative 07/12/2023 18  % Final   Lymphs Abs 07/12/2023 1.4  0.7 - 4.0 K/uL Final   Monocytes Relative 07/12/2023 4  % Final   Monocytes Absolute 07/12/2023 0.3  0.1 - 1.0 K/uL Final   Eosinophils Relative 07/12/2023 2  % Final   Eosinophils Absolute 07/12/2023 0.1  0.0 - 0.5 K/uL Final   Basophils Relative 07/12/2023 1  % Final   Basophils Absolute 07/12/2023 0.0  0.0 - 0.1 K/uL Final   Immature Granulocytes 07/12/2023 0  % Final   Abs Immature Granulocytes 07/12/2023 0.03  0.00 - 0.07 K/uL Final   Performed at [facility], [address], [place], [place] [address]   Sodium 07/12/2023 138  135 - 145 mmol/L Final   Potassium 07/12/2023 3.6  3.5 - 5.1 mmol/L Final   Chloride 07/12/2023 101  98 - 111 mmol/L Final   CO2 07/12/2023 25  22 - 32 mmol/L Final   Glucose, Bld 07/12/2023 119 (H)  70 - 99 mg/dL Final   Glucose reference range applies only to samples taken after fasting for at least 8 hours.   BUN 07/12/2023 <5 (L)  6 - 20 mg/dL Final   Creatinine, Ser 07/12/2023 0.93  0.61 - 1.24 mg/dL Final   Calcium 64/33/2951 9.6  8.9 - 10.3 mg/dL Final   Total Protein 88/41/6606 8.0  6.5 - 8.1 g/dL Final   Albumin 30/16/0109 4.4  3.5 - 5.0 g/dL Final   AST 32/35/5732 24  15 - 41 U/L Final   ALT 07/12/2023 20  0 - 44 U/L Final   Alkaline Phosphatase 07/12/2023 60  38 - 126 U/L Final   Total Bilirubin 07/12/2023 0.6  0.3 - 1.2 mg/dL Final   GFR, Estimated 07/12/2023 >60  >60 mL/min Final   Comment: (NOTE) Calculated using the CKD-EPI Creatinine Equation (2021)    Anion gap 07/12/2023 12  5 - 15 Final   Performed at [facility], [address], [place], [place] [address]   Hgb A1c MFr Bld 07/12/2023 5.5  4.8 - 5.6 %  Final   Comment: (NOTE) Pre diabetes:          5.7%-6.4%  Diabetes:              >6.4%  Glycemic control for    <7.0% adults with diabetes    Mean Plasma Glucose 07/12/2023 111.15  mg/dL Final   Performed at [facility], [address], [place], [place] [address]   Magnesium 07/12/2023 2.0  1.7 - 2.4 mg/dL Final   Performed at [facility], [address] [place], [place] [address]   Alcohol, Ethyl (B) 07/12/2023 <10  <10 mg/dL Final   Comment: (NOTE) Lowest detectable limit for serum alcohol is 10 mg/dL.  For medical purposes only. Performed at [facility], [address], [place], [place] [address]    Color, Urine 07/12/2023 YELLOW  YELLOW Final   APPearance 07/12/2023 CLEAR  CLEAR Final   Specific Gravity, Urine 07/12/2023 1.009  1.005 - 1.030 Final   pH 07/12/2023 8.0  5.0 - 8.0 Final   Glucose, UA 07/12/2023 NEGATIVE  NEGATIVE mg/dL Final   Hgb urine dipstick 07/12/2023 NEGATIVE  NEGATIVE Final   Bilirubin Urine 07/12/2023 NEGATIVE  NEGATIVE Final   Ketones, ur 07/12/2023 NEGATIVE  NEGATIVE mg/dL Final   Protein, ur 91/47/8295 NEGATIVE  NEGATIVE mg/dL Final   Nitrite 62/13/0865 NEGATIVE  NEGATIVE Final   Leukocytes,Ua 07/12/2023 NEGATIVE  NEGATIVE Final   Performed at [facility], [address] [place] [address], [place], [place] [address]   POC Amphetamine UR 07/12/2023 None Detected  NONE DETECTED (Cut Off Level 1000 ng/mL) Final   POC Secobarbital (BAR) 07/12/2023 None Detected  NONE DETECTED (Cut Off Level 300 ng/mL) Final   POC Buprenorphine (BUP) 07/12/2023 None Detected  NONE DETECTED (Cut Off Level 10 ng/mL) Final   POC Oxazepam (BZO) 07/12/2023 Positive (A)  NONE DETECTED (Cut Off Level 300 ng/mL) Final   POC Cocaine UR 07/12/2023 None Detected  NONE DETECTED (Cut Off Level 300 ng/mL) Final   POC Methamphetamine UR 07/12/2023 None Detected  NONE DETECTED (Cut Off Level 1000 ng/mL) Final   POC Morphine 07/12/2023 Positive (A)  NONE DETECTED (Cut Off Level 300 ng/mL) Final   POC Methadone UR 07/12/2023 None Detected  NONE DETECTED (Cut Off Level 300 ng/mL) Final   POC  Oxycodone UR 07/12/2023 Positive (A)  NONE DETECTED (Cut Off Level 100 ng/mL) Final   POC Marijuana UR 07/12/2023 Positive (A)  NONE DETECTED (Cut Off Level 50 ng/mL) Final   Cholesterol 07/12/2023 142  0 - 200 mg/dL Final   Triglycerides 62/95/2841 57  <150 mg/dL Final   HDL 32/44/0102 56  >40 mg/dL Final   Total CHOL/HDL Ratio 07/12/2023 2.5  RATIO Final   VLDL 07/12/2023 11  0 - 40 mg/dL Final   LDL Cholesterol 07/12/2023 75  0 - 99 mg/dL Final   Comment:        Total Cholesterol/HDL:CHD Risk Coronary Heart Disease Risk Table                     Men   Women  1/2 Average Risk   3.4   3.3  Average Risk       5.0   4.4  2 X Average Risk   9.6   7.1  3 X Average Risk  23.4   11.0        Use the calculated Patient Ratio above and the CHD Risk Table to determine the patient's CHD Risk.  ATP III CLASSIFICATION (LDL):  <100     mg/dL   Optimal  161-096  mg/dL   Near or Above                    Optimal  130-159  mg/dL   Borderline  045-409  mg/dL   High  >811     mg/dL   Very High Performed at [facility], [address] [place], [place] [address]    TSH 07/12/2023 0.189 (L)  0.350 - 4.500 uIU/mL Final   Comment: Performed by a 3rd Generation assay with a functional sensitivity of <=0.01 uIU/mL. Performed at [facility], [address], [place], [place] [address]     Allergies: Patient has no known allergies.  Medications:  Facility Ordered Medications  Medication   [COMPLETED] lactated ringers bolus 1,000 mL   acetaminophen (TYLENOL) tablet 650 mg   alum & mag hydroxide-simeth (MAALOX/MYLANTA) 200-200-20 MG/5ML suspension 30 mL   magnesium hydroxide (MILK OF MAGNESIA) suspension 30 mL   dicyclomine (BENTYL) tablet 20 mg   hydrOXYzine (ATARAX) tablet 25 mg   loperamide (IMODIUM) capsule 2-4 mg   methocarbamol (ROBAXIN) tablet 500 mg   naproxen (NAPROSYN) tablet 500 mg   ondansetron (ZOFRAN-ODT) disintegrating tablet 4 mg   cloNIDine (CATAPRES) tablet  0.1 mg   Followed by   Melene Muller ON 07/15/2023] cloNIDine (CATAPRES) tablet 0.1 mg   Followed by   Melene Muller ON 07/17/2023] cloNIDine (CATAPRES) tablet 0.1 mg   OLANZapine zydis (ZYPREXA) disintegrating tablet 10 mg   And   LORazepam (ATIVAN) tablet 1 mg   And   ziprasidone (GEODON) injection 20 mg   PTA Medications  Medication Sig   folic acid (FOLVITE) 1 MG tablet Take 1 tablet (1 mg total) by mouth daily.   Multiple Vitamin (MULTIVITAMIN WITH MINERALS) TABS tablet Take 1 tablet by mouth daily.   thiamine (VITAMIN B-1) 100 MG tablet Take 1 tablet (100 mg total) by mouth daily.   ketorolac (TORADOL) 10 MG tablet Take 1 tablet (10 mg total) by mouth every 6 (six) hours as needed (pain).   tiZANidine (ZANAFLEX) 4 MG tablet Take 1 tablet (4 mg total) by mouth every 8 (eight) hours as needed for muscle spasms.      Medical Decision Making  Inpatient observation with possibly assessment for [facility] when a bed becomes available   Meds ordered this encounter  Medications   acetaminophen (TYLENOL) tablet 650 mg   alum & mag hydroxide-simeth (MAALOX/MYLANTA) 200-200-20 MG/5ML suspension 30 mL   magnesium hydroxide (MILK OF MAGNESIA) suspension 30 mL   dicyclomine (BENTYL) tablet 20 mg   hydrOXYzine (ATARAX) tablet 25 mg   loperamide (IMODIUM) capsule 2-4 mg   methocarbamol (ROBAXIN) tablet 500 mg   naproxen (NAPROSYN) tablet 500 mg   ondansetron (ZOFRAN-ODT) disintegrating tablet 4 mg   FOLLOWED BY Linked Order Group    cloNIDine (CATAPRES) tablet 0.1 mg    cloNIDine (CATAPRES) tablet 0.1 mg    cloNIDine (CATAPRES) tablet 0.1 mg   AND Linked Order Group    OLANZapine zydis (ZYPREXA) disintegrating tablet 10 mg    LORazepam (ATIVAN) tablet 1 mg    ziprasidone (GEODON) injection 20 mg    . Lab Orders  No laboratory test(s) ordered today    Recommendations  Based on my evaluation the patient does not appear to have an emergency medical condition.  Sindy Guadeloupe, NP 07/13/23  5:50 AM

## 2023-07-13 NOTE — ED Notes (Signed)
Report give n to brittany rn . Patient taken to [facility] .

## 2023-07-13 NOTE — Progress Notes (Signed)
Treatment team was unable to speak with patient due to withdrawals. Team attempted to contact the patient's mother Alyson Reedy [phone] to gain collateral, however received no answer. Team contacted the patient's brother Wilson Dusenbery for collateral and was able to obtain the following information:

## 2023-07-13 NOTE — ED Notes (Signed)
Pt stated, "I'm thirsty". Writer provided pt with Gatorade and ice in cup. Informed pt to notify staff with any needs or concerns. Denies withdrawal sx at present. Will continue to monitor for safety.

## 2023-07-13 NOTE — ED Notes (Signed)
Patient alert and oriented x 3. Denies SI/HI/AVH. Denies intent or plan to harm self or others. Routine conducted according to faculty protocol. Encourage patient to notify staff with any needs or concerns. Patient verbalized agreement and understanding. Will continue to monitor for safety. 

## 2023-07-13 NOTE — ED Notes (Signed)
Patient observed/assessed in patient room lying down asleep in bed. Patient aroused with verbal stimulation after several attempts to which patient responded in an agitated/annoyed demeanor.Affect is flat/agitated and eye contact is minimal. Patient evasive to preliminary assessment questions. Pt is having tremors. Denies S/I and H/I. Denies A/V/H. Will continue to attempt to encourage patient to participate within the community and not solely isolate in room. Will continue to monitor for safety.

## 2023-07-13 NOTE — ED Notes (Signed)
Safe tx called

## 2023-07-13 NOTE — Progress Notes (Signed)
Patient transferred from [facility] to [facility] requesting assistance in detox from fentanyl abuse. Patient was accompanied to unit via staff. Patient seated in hallway and curled into self, with head in lap. Emesis bag given. Consent to care form signed and patient brought to room. Patient unsteady on feet, visibly swaying side to side. Yellow armband in place and gray slip resistant socks on. Patient in bed safely. Education provided regarding fall prevention. Handoff reported nausea and 1 incidence of vomiting prior to coming to unit. IM Phenergan given upon arrival to unit. Patient unable to complete admission paperwork at this time, due to nausea. Patient verbally contract for safety. Safety checks in place according to facility policy. Patient denies meal and drink at this time.

## 2023-07-13 NOTE — ED Notes (Signed)
Patient is sleeping. Respirations equal and unlabored, skin warm and dry, NAD. No change in assessment or acuity. Routine safety checks conducted according to facility protocol. Will continue to monitor for safety.   

## 2023-07-13 NOTE — Group Note (Signed)
Group Topic: Positive Affirmations  Group Date: 07/13/2023 Start Time: 1130 End Time: 1202 Facilitators: Vonzell Schlatter B  Department: [facility]  Number of Participants: 6  Group Focus: affirmation and daily focus Treatment Modality:  Psychoeducation Interventions utilized were problem solving Purpose: regain self-worth and relapse prevention strategies  Name: [name] Date of Birth: Feb 17, 1996  MR: [id number]    Level of Participation: Did not attend group pt is not feeling wellwithdrawn Quality of Participation: withdrawn Interactions with others:  Mood/Affect:  Triggers (if applicable):  Cognition:  Progress: None Response:  Plan:   Patients Problems:  Patient Active Problem List   Diagnosis Date Noted   Opioid dependence with withdrawal (HCC) 07/13/2023   Fentanyl use disorder, severe, 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

## 2023-07-14 ENCOUNTER — Encounter (HOSPITAL_COMMUNITY): Payer: Self-pay | Admitting: Registered Nurse

## 2023-07-14 DIAGNOSIS — F1193 Opioid use, unspecified with withdrawal: Secondary | ICD-10-CM | POA: Diagnosis not present

## 2023-07-14 LAB — POCT URINE DRUG SCREEN - MANUAL ENTRY (I-SCREEN)
POC Amphetamine UR: NOT DETECTED
POC Buprenorphine (BUP): POSITIVE — AB
POC Cocaine UR: NOT DETECTED
POC Marijuana UR: NOT DETECTED
POC Methadone UR: NOT DETECTED
POC Methamphetamine UR: NOT DETECTED
POC Morphine: NOT DETECTED
POC Oxazepam (BZO): NOT DETECTED
POC Oxycodone UR: NOT DETECTED
POC Secobarbital (BAR): NOT DETECTED

## 2023-07-14 MED ORDER — BUPRENORPHINE HCL 2 MG SL SUBL
2.0000 mg | SUBLINGUAL_TABLET | Freq: Once | SUBLINGUAL | Status: AC
  Administered 2023-07-14: 2 mg via SUBLINGUAL
  Filled 2023-07-14: qty 1

## 2023-07-14 MED ORDER — BUPRENORPHINE HCL 2 MG SL SUBL
2.0000 mg | SUBLINGUAL_TABLET | Freq: Every day | SUBLINGUAL | Status: AC
  Administered 2023-07-14: 2 mg via SUBLINGUAL
  Filled 2023-07-14: qty 1

## 2023-07-14 MED ORDER — BUPRENORPHINE HCL 2 MG SL SUBL
2.0000 mg | SUBLINGUAL_TABLET | Freq: Two times a day (BID) | SUBLINGUAL | Status: DC
  Administered 2023-07-15 – 2023-07-17 (×5): 2 mg via SUBLINGUAL
  Filled 2023-07-14 (×5): qty 1

## 2023-07-14 MED ORDER — MELATONIN 5 MG PO TABS
5.0000 mg | ORAL_TABLET | Freq: Every day | ORAL | Status: DC
  Administered 2023-07-14 – 2023-07-18 (×5): 5 mg via ORAL
  Filled 2023-07-14 (×5): qty 1

## 2023-07-14 NOTE — Group Note (Signed)
Group Topic: Wellness  Group Date: 07/14/2023 Start Time: 1000 End Time: 1030 Facilitators: Londell Moh, NT  Department: [facility]  Number of Participants: 5  Group Focus: check in and leisure skills Treatment Modality:  Psychoeducation Interventions utilized were group exercise and mental fitness Purpose: increase insight  Name: Dakota [name] Date of Birth: 1995-10-29  MR: [id number]    Level of Participation: Pt did not attend group Patients Problems:  Patient Active Problem List   Diagnosis Date Noted   Opioid dependence with withdrawal (HCC) 07/13/2023   Fentanyl use disorder, severe, 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

## 2023-07-14 NOTE — ED Notes (Signed)
Night time medication has been given. Patient requested melatonin to help him sleep. Request was relayed to providers who has placed orders. Will give melatonin to patient and continue to monitor for safety.

## 2023-07-14 NOTE — ED Notes (Signed)
 Pt was provided lunch

## 2023-07-14 NOTE — ED Notes (Signed)
Patient alert & oriented x4. Denies intent to harm self or others when asked. Denies A/VH. Patient reports fever and cold sweats, as well as nausea but denies further detox symptoms. Last taken temperature 100.4 F. Refused nausea medication at this time but stated they would let staff know if that changes. No acute distress noted. Support and encouragement provided. Routine safety checks conducted per facility protocol. Encouraged patient to notify staff if any thoughts of harm towards self or others arise. Patient verbalizes understanding and agreement.

## 2023-07-14 NOTE — ED Provider Notes (Signed)
Behavioral Health Progress Note  Date and Time: 07/14/2023 3:00 PM Name: Dakota [name] MRN:  [id number]  Subjective:   Dakota [name] is a 27 year old male with no significant past medical history who has a history of one prior hospitalization for opiate withdrawal (Nov. 2023), who came to the [facility] on 9/30 voluntary by brother from home for complaint of opiate withdrawal and was admitted to [facility] on 10/01 for detox and is seeking residential rehabilitation.  Patient evaluated at bedside and sat up to face and speak with examiner. This morning he reported feeling like his spine ached from withdrawals and stated that his past withdrawals felt like this. Patient described having fever, hot flashes, difficulty sleeping and eating due to nausea, and 5-6 episodes of vomiting and diarrhea in the past day. Patient also felt hungry and asked staff for gatorade and some fruit cups, and he stated that he felt better after eating a little. Later in the morning he was sitting in the dining room but still feeling nauseous. Reports he felt better after receiving phenergan and subutex.  Patient reports that last year he lost 13 loved ones who passed away due to various causes, including completed suicide, drug overdose, and violence. Since then he had been using opiates and ecstasy regularly. Patient reports that weed was his gateway drug, and he began taking opioids since he was a teenager: Started with vicodin, then promethazine, codeine, xanax, adderall, then ecstasy x 6-7 years and quit cold [place] 6 months ago.   Recently patient has been sniffing 10-15 oxycodone 30mg  with fentanyl because they were "cheaper and hit harder". Dealer recently ran out of oxycodone, and due to subsequent lack of supply, patient started to have withdrawal symptoms with cold sweats. Patient asked his brother for a couple of xanax to help with withdrawals and took some multivitamins "for added  strength." He reports feeling like his insides were wrung like a towel, felt an acidic feeling in his mouth, and had difficulty breathing. The symptoms did not improve, and patient asked brother to bring him to hospital. He was brought to [facility] initially and had a short transfer to [facility] due to significant GI distress and vomiting. Pt returned to [facility] and admitted to [facility] for detox.   Reported sleep not great because of withdrawals. Reported feeling hungry this morning after feeling nauseated yesterday and throughout the night. This morning he was able to keep most of his food down and was frequently hydrating with water and oral rehydration supplementation. Reported mood is "a lot better, but still weak" today. Reported depression "inviting these feelings because I wasn't feeling before" 5/10, anxiety 7-8/10 and worried about daughter, mother, loss of friends; where 10 is most severe. Patient said he felt "a lot better" and stated he is still having cravings. He reports asking for his stockpile at home to be taken away.  Reported goals for today include wanting to fight to stay sober, wants to feel again. 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 Felt like he had a career, which kept him motivated  On interview, suicidal ideations are not present. Thoughts of self harm are not present. Homicidal ideations are not present.   There are no auditory hallucinations or visual hallucinations.   Side effects to currently prescribed medications are None. There are no somatic complaints. Reports regular bowel movements.  Patient interested in residential rehabilitation. Encouraged patient to take care of self. When sober  aims to have free will, save money, be a present father, son, and brother. He stated he wants to be more active, think more clearly and more intentional with days and time.      Diagnosis:  Final diagnoses:  None    Total Time spent with  patient: 1 hour Past Psychiatric Hx:  Patient denies past psychiatric history, rehabilitation. Does not have outpatient follow-up.  Substance Abuse Hx: Alcohol: denies Tobacco: 0.5 pack per day since 27 y.o. Cannabis: denies current use. Substance use: Currently fentanyl, oxycodone by sniffing it.  - Stated weed in his early teens. Began taking opioids since he was a teenager. History of using vicodin, promethazine, codeine, xanax, adderall.  - Used Ecstasy x 6-7 years and quit cold [place] 6 months ago.  - Denies current heroin, meth, IV drug use. Rx drug abuse: Percocet, adderall Rehab hx: None    Past Medical History: Allergies: No known drug allergies Hospitalizations per chart review:             - Hospitalized in 2023: fentanyl and percocet withdrawal, AKI - Hospitalized in 2014: altered mental status and possible seizure activity, UDS positive cannabinoids. Dx as likely a functional disorder with a possible underlying viral syndrome. Normal head CT, CSF study - previously diagnosed with conversion disorder at 27 y.o. Seizures: Seizure like activity documented in 2014 and 2020. Described some shaking activity that woke him from his sleep.     Family Medical History: - Family history of heart conditions, hypertension   Family Psychiatric History: Psychiatric Dx: some relatives with unspecified psychiatric disorder.  - per chart review, paternal aunt with bipolar II disorder Family substance use: Patient describes multiple relatives have a substance use disorder - mother - marijuana . 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 daughter Porfirio Mylar, stays with mother; sees pt each weekend. Provides financial support Legal: No charges, court dates Military: None. Access to firearms: Patient reports brother lost his gun. Per brother, none.     Current Medications:  Current Facility-Administered Medications  Medication Dose Route Frequency Provider Last Rate Last Admin   acetaminophen (TYLENOL) tablet 650 mg  650 mg Oral Q6H PRN Rankin, Shuvon B, NP   650 mg at 07/13/23 1816   alum & mag hydroxide-simeth (MAALOX/MYLANTA) 200-200-20 MG/5ML suspension 30 mL  30 mL Oral Q4H PRN Rankin, Shuvon B, NP       [START ON 07/15/2023] buprenorphine (SUBUTEX) SL tablet 2 mg  2 mg Sublingual Q12H Carrion-Carrero, Margely, MD       buprenorphine (SUBUTEX) SL tablet 2 mg  2 mg Sublingual QHS Carrion-Carrero, Margely, MD       cloNIDine (CATAPRES) tablet 0.1 mg  0.1 mg Oral QID Rankin, Shuvon B, NP   0.1 mg at 07/14/23 1440   Followed by   Melene Muller ON 07/15/2023] cloNIDine (CATAPRES) tablet 0.1 mg  0.1 mg Oral BH-qamhs Rankin, Shuvon B, NP       Followed by   Melene Muller ON 07/18/2023] cloNIDine (CATAPRES) tablet 0.1 mg  0.1 mg Oral QAC breakfast Rankin, Shuvon B, NP       dicyclomine (BENTYL) tablet 20 mg  20 mg Oral Q6H PRN Rankin, Shuvon B, NP  hydrOXYzine (ATARAX) tablet 25 mg  25 mg Oral Q6H PRN Rankin, Shuvon B, NP   25 mg at 07/14/23 6962   loperamide (IMODIUM) capsule 2-4 mg  2-4 mg Oral PRN Rankin, Shuvon B, NP       magnesium hydroxide (MILK OF MAGNESIA) suspension 30 mL  30 mL Oral Daily PRN Rankin, Shuvon B, NP       methocarbamol (ROBAXIN) tablet 500 mg  500 mg Oral Q8H PRN Rankin, Shuvon B, NP   500 mg at 07/14/23 0920   naproxen (NAPROSYN) tablet 500 mg  500 mg Oral BID PRN Rankin, Shuvon B, NP   500 mg at 07/14/23 9528   nicotine (NICODERM CQ - dosed in mg/24 hours) patch 14 mg  14 mg Transdermal Daily PRN Carrion-Carrero, Karle Starch, MD   14 mg at 07/14/23 1016   promethazine (PHENERGAN) injection 25 mg   25 mg Intramuscular Q8H PRN Carrion-Carrero, Karle Starch, MD   25 mg at 07/14/23 1239   No current outpatient medications on file.    Labs  Lab Results:  Admission on 07/13/2023  Component Date Value Ref Range Status   POC Amphetamine UR 07/14/2023 None Detected  NONE DETECTED (Cut Off Level 1000 ng/mL) Final   POC Secobarbital (BAR) 07/14/2023 None Detected  NONE DETECTED (Cut Off Level 300 ng/mL) Final   POC Buprenorphine (BUP) 07/14/2023 Positive (A)  NONE DETECTED (Cut Off Level 10 ng/mL) Final   POC Oxazepam (BZO) 07/14/2023 None Detected  NONE DETECTED (Cut Off Level 300 ng/mL) Final   POC Cocaine UR 07/14/2023 None Detected  NONE DETECTED (Cut Off Level 300 ng/mL) Final   POC Methamphetamine UR 07/14/2023 None Detected  NONE DETECTED (Cut Off Level 1000 ng/mL) Final   POC Morphine 07/14/2023 None Detected  NONE DETECTED (Cut Off Level 300 ng/mL) Final   POC Methadone UR 07/14/2023 None Detected  NONE DETECTED (Cut Off Level 300 ng/mL) Final   POC Oxycodone UR 07/14/2023 None Detected  NONE DETECTED (Cut Off Level 100 ng/mL) Final   POC Marijuana UR 07/14/2023 None Detected  NONE DETECTED (Cut Off Level 50 ng/mL) Final  Admission on 07/12/2023, Discharged on 07/13/2023  Component Date Value Ref Range Status   Sodium 07/12/2023 141  135 - 145 mmol/L Final   Potassium 07/12/2023 3.7  3.5 - 5.1 mmol/L Final   Chloride 07/12/2023 106  98 - 111 mmol/L Final   CO2 07/12/2023 24  22 - 32 mmol/L Final   Glucose, Bld 07/12/2023 122 (H)  70 - 99 mg/dL Final   Glucose reference range applies only to samples taken after fasting for at least 8 hours.   BUN 07/12/2023 <5 (L)  6 - 20 mg/dL Final   Creatinine, Ser 07/12/2023 1.05  0.61 - 1.24 mg/dL Final   Calcium 41/32/4401 9.5  8.9 - 10.3 mg/dL Final   GFR, Estimated 07/12/2023 >60  >60 mL/min Final   Comment: (NOTE) Calculated using the CKD-EPI Creatinine Equation (2021)    Anion gap 07/12/2023 11  5 - 15 Final   Performed at [facility], [address], [place], [place] [address]   Magnesium 07/12/2023 1.9  1.7 - 2.4 mg/dL Final   Performed at [facility], [address], [place], [place] [address]  Admission on 07/12/2023, Discharged on 07/12/2023  Component Date Value Ref Range Status   WBC 07/12/2023 7.8  4.0 - 10.5 K/uL Final   RBC 07/12/2023 4.50  4.22 - 5.81 MIL/uL Final   Hemoglobin 07/12/2023  11.5 (L)  13.0 - 17.0 g/dL Final   HCT 78/29/5621 35.3 (L)  39.0 - 52.0 % Final   MCV 07/12/2023 78.4 (L)  80.0 - 100.0 fL Final   MCH 07/12/2023 25.6 (L)  26.0 - 34.0 pg Final   MCHC 07/12/2023 32.6  30.0 - 36.0 g/dL Final   RDW 30/86/5784 14.0  11.5 - 15.5 % Final   Platelets 07/12/2023 405 (H)  150 - 400 K/uL Final   nRBC 07/12/2023 0.0  0.0 - 0.2 % Final   Neutrophils Relative % 07/12/2023 75  % Final   Neutro Abs 07/12/2023 5.9  1.7 - 7.7 K/uL Final   Lymphocytes Relative 07/12/2023 18  % Final   Lymphs Abs 07/12/2023 1.4  0.7 - 4.0 K/uL Final   Monocytes Relative 07/12/2023 4  % Final   Monocytes Absolute 07/12/2023 0.3  0.1 - 1.0 K/uL Final   Eosinophils Relative 07/12/2023 2  % Final   Eosinophils Absolute 07/12/2023 0.1  0.0 - 0.5 K/uL Final   Basophils Relative 07/12/2023 1  % Final   Basophils Absolute 07/12/2023 0.0  0.0 - 0.1 K/uL Final   Immature Granulocytes 07/12/2023 0  % Final   Abs Immature Granulocytes 07/12/2023 0.03  0.00 - 0.07 K/uL Final   Performed at [facility], [address], [place], [place] [address]   Sodium 07/12/2023 138  135 - 145 mmol/L Final   Potassium 07/12/2023 3.6  3.5 - 5.1 mmol/L Final   Chloride 07/12/2023 101  98 - 111 mmol/L Final   CO2 07/12/2023 25  22 - 32 mmol/L Final   Glucose, Bld 07/12/2023 119 (H)  70 - 99 mg/dL Final   Glucose reference range applies only to samples taken after fasting for at least 8 hours.   BUN 07/12/2023 <5 (L)  6 - 20 mg/dL Final   Creatinine, Ser 07/12/2023 0.93  0.61 - 1.24 mg/dL Final   Calcium 52/84/1324 9.6  8.9 -  10.3 mg/dL Final   Total Protein 40/07/2724 8.0  6.5 - 8.1 g/dL Final   Albumin 36/64/4034 4.4  3.5 - 5.0 g/dL Final   AST 74/25/9563 24  15 - 41 U/L Final   ALT 07/12/2023 20  0 - 44 U/L Final   Alkaline Phosphatase 07/12/2023 60  38 - 126 U/L Final   Total Bilirubin 07/12/2023 0.6  0.3 - 1.2 mg/dL Final   GFR, Estimated 07/12/2023 >60  >60 mL/min Final   Comment: (NOTE) Calculated using the CKD-EPI Creatinine Equation (2021)    Anion gap 07/12/2023 12  5 - 15 Final   Performed at [facility], [address] [place], [place] [address]   Hgb A1c MFr Bld 07/12/2023 5.5  4.8 - 5.6 % Final   Comment: (NOTE) Pre diabetes:          5.7%-6.4%  Diabetes:              >6.4%  Glycemic control for   <7.0% adults with diabetes    Mean Plasma Glucose 07/12/2023 111.15  mg/dL Final   Performed at [facility], [address] [place], [place] [address]   Magnesium 07/12/2023 2.0  1.7 - 2.4 mg/dL Final   Performed at [facility], [address] [place], [place] [address]   Alcohol, Ethyl (B) 07/12/2023 <10  <10 mg/dL Final   Comment: (NOTE) Lowest detectable limit for serum alcohol is 10 mg/dL.  For medical purposes only. Performed at [facility], [address], [place], [place] [address]    Prolactin 07/12/2023 3.2 (L)  3.6 - 31.5 ng/mL Final   Comment: (NOTE) Performed At: [facility] [address] [place], [place] [id number] Jolene Schimke MD XB:[id number]    Color, Urine 07/12/2023 YELLOW  YELLOW Final   APPearance 07/12/2023 CLEAR  CLEAR Final   Specific Gravity, Urine 07/12/2023 1.009  1.005 - 1.030 Final   pH 07/12/2023 8.0  5.0 - 8.0 Final   Glucose, UA 07/12/2023 NEGATIVE  NEGATIVE mg/dL Final   Hgb urine dipstick 07/12/2023 NEGATIVE  NEGATIVE Final   Bilirubin Urine 07/12/2023 NEGATIVE  NEGATIVE Final   Ketones, ur 07/12/2023 NEGATIVE  NEGATIVE mg/dL Final   Protein, ur 30/86/5784 NEGATIVE  NEGATIVE mg/dL Final   Nitrite 69/62/9528  NEGATIVE  NEGATIVE Final   Leukocytes,Ua 07/12/2023 NEGATIVE  NEGATIVE Final   Performed at [facility], [address], [place], [place] [address]   POC Amphetamine UR 07/12/2023 None Detected  NONE DETECTED (Cut Off Level 1000 ng/mL) Final   POC Secobarbital (BAR) 07/12/2023 None Detected  NONE DETECTED (Cut Off Level 300 ng/mL) Final   POC Buprenorphine (BUP) 07/12/2023 None Detected  NONE DETECTED (Cut Off Level 10 ng/mL) Final   POC Oxazepam (BZO) 07/12/2023 Positive (A)  NONE DETECTED (Cut Off Level 300 ng/mL) Final   POC Cocaine UR 07/12/2023 None Detected  NONE DETECTED (Cut Off Level 300 ng/mL) Final   POC Methamphetamine UR 07/12/2023 None Detected  NONE DETECTED (Cut Off Level 1000 ng/mL) Final   POC Morphine 07/12/2023 Positive (A)  NONE DETECTED (Cut Off Level 300 ng/mL) Final   POC Methadone UR 07/12/2023 None Detected  NONE DETECTED (Cut Off Level 300 ng/mL) Final   POC Oxycodone UR 07/12/2023 Positive (A)  NONE DETECTED (Cut Off Level 100 ng/mL) Final   POC Marijuana UR 07/12/2023 Positive (A)  NONE DETECTED (Cut Off Level 50 ng/mL) Final   Cholesterol 07/12/2023 142  0 - 200 mg/dL Final   Triglycerides 40/07/2724 57  <150 mg/dL Final   HDL 36/64/4034 56  >40 mg/dL Final   Total CHOL/HDL Ratio 07/12/2023 2.5  RATIO Final   VLDL 07/12/2023 11  0 - 40 mg/dL Final   LDL Cholesterol 07/12/2023 75  0 - 99 mg/dL Final   Comment:        Total Cholesterol/HDL:CHD Risk Coronary Heart Disease Risk Table                     Men   Women  1/2 Average Risk   3.4   3.3  Average Risk       5.0   4.4  2 X Average Risk   9.6   7.1  3 X Average Risk  23.4   11.0        Use the calculated Patient Ratio above and the CHD Risk Table to determine the patient's CHD Risk.        ATP III CLASSIFICATION (LDL):  <100     mg/dL   Optimal  742-595  mg/dL   Near or Above                    Optimal  130-159  mg/dL   Borderline  638-756  mg/dL   High  >433     mg/dL   Very High Performed  at [facility], [address] [place], [place] [address]    TSH 07/12/2023 0.189 (L)  0.350 - 4.500 uIU/mL Final   Comment: Performed by a 3rd Generation assay with a functional sensitivity of <=0.01 uIU/mL. Performed at [facility], [address], [place], [place] [address]     Blood Alcohol level:  Lab Results  Component Value Date   [facility] <10 07/12/2023   ETH <10 09/09/2022    Metabolic Disorder Labs: Lab Results  Component Value Date   HGBA1C 5.5 07/12/2023   MPG 111.15 07/12/2023   Lab Results  Component Value Date   PROLACTIN 3.2 (L) 07/12/2023   Lab Results  Component Value Date   CHOL 142 07/12/2023   TRIG 57 07/12/2023   HDL 56 07/12/2023   CHOLHDL 2.5 07/12/2023   VLDL 11 07/12/2023   LDLCALC 75 07/12/2023   LDLCALC 73 05/27/2021    Therapeutic Lab Levels: No results found for: "LITHIUM" No results found for: "VALPROATE" No results found for: "CBMZ"  Physical Findings   PHQ2-9    Flowsheet Row ED from 07/13/2023 in [facility] Office Visit from 05/27/2021 in [facility] [place]  PHQ-2 Total Score 0 0      Flowsheet Row ED from 07/13/2023 in [facility] Most recent reading at 07/13/2023 12:37 PM ED from 07/13/2023 in [facility] Most recent reading at 07/13/2023  2:25 AM ED from 07/12/2023 in [facility] Most recent reading at 07/12/2023  5:38 PM  C-SSRS RISK CATEGORY No Risk No Risk No Risk        Musculoskeletal  Strength & Muscle Tone: within normal limits Gait & Station: normal Patient leans: N/A  Psychiatric Specialty Exam  Presentation  General Appearance:  Appropriate for Environment; Casual  Eye Contact: Minimal  Speech: Garbled  Speech Volume: Decreased  Handedness: Right   Mood and Affect  Mood: Anxious  Affect: Tearful; Congruent   Thought Process  Thought  Processes: Coherent; Goal Directed  Descriptions of Associations: None  Orientation:Full (Time, Place and Person)  Thought Content:Abstract Reasoning; Logical  Diagnosis of Schizophrenia or Schizoaffective disorder in past: No    Hallucinations:Hallucinations: None  Ideas of Reference:None  Suicidal Thoughts:Suicidal Thoughts: No  Homicidal Thoughts:Homicidal Thoughts: No   Sensorium  Memory: Immediate Good; Recent Good  Judgment: Fair  Insight: Fair   Art therapist  Concentration: Good  Attention Span: Good  Recall: Good  Fund of Knowledge: Good  Language: Good   Psychomotor Activity  Psychomotor Activity: Psychomotor Activity: Tremor   Assets  Assets: Desire for Improvement; Housing; Vocational/Educational   Sleep  Sleep: Sleep: Poor Number of Hours of Sleep: 6   Nutritional Assessment (For OBS and FBC admissions only) Has the patient had a weight loss or gain of 10 pounds or more in the last 3 months?: No Has the patient had a decrease in food intake/or appetite?: No Does the patient have dental problems?: No Does the patient have eating habits or behaviors that may be indicators of an eating disorder including binging or inducing vomiting?: No Has the patient recently lost weight without trying?: 0 Has the patient been eating poorly because of a decreased appetite?: 0 Malnutrition Screening Tool Score: 0    Physical Exam  Physical Exam Vitals reviewed.  Constitutional:      General: He is not in acute distress.    Appearance: He is ill-appearing. He is not toxic-appearing or diaphoretic.  HENT:     Head: Normocephalic and atraumatic.  Pulmonary:  Effort: Pulmonary effort is normal.  Neurological:     General: No focal deficit present.     Mental Status: He is alert and oriented to person, place, and time.     Gait: Gait normal.    Review of Systems  Constitutional:  Positive for fever and malaise/fatigue.        "Hot flashes"  Gastrointestinal:  Positive for diarrhea, nausea and vomiting.  Musculoskeletal:  Positive for back pain.  Neurological:  Positive for tremors. Negative for dizziness, weakness and headaches.  Psychiatric/Behavioral:  Positive for substance abuse. Negative for hallucinations and suicidal ideas.    Blood pressure (!) 145/89, pulse 66, temperature 99.2 F (37.3 C), temperature source Oral, resp. rate 17, SpO2 100%. There is no height or weight on file to calculate BMI.  Treatment Plan Summary: Daily contact with patient to assess and evaluate symptoms and progress in treatment and Medication management.  Interaction with patient was initially limited due to ongoing withdrawal symptoms, lethargy, and withdrawing to his bed.  Per collateral call with patient's brother, who brought patient to [facility], plan is to transition patient to residential rehabilitation.     Opioid use disorder - Start subutex 2 mg BID - Per detox protocol - Continue Clonidine taper   -PRNs: Tylenol 650 mg every 6 hours as needed Maalox Mylanta every 4 hours as needed Bentyl every 6 hours as needed Atarax 25 mg every 6 hours as needed Imodium as needed for diarrhea Milk of Magnesia daily as needed Robaxin 500 mg every 8 hours as needed Naproxen 500 mg twice daily as needed Phenergan injection 25 mg every 8 hours as needed   Nicotine use disorder Encourage smoking cessation Nicotine patch 14 mg daily as needed     Labs/Imaging Reviewed: CBC showing microcytic anemia (hemoglobin 11.5, MCV 78.4), platelets 405 K/uL; CMP showing some hyperglycemia glucose 119, otherwise unremarkable; A1c 5.5%; ethanol negative; magnesium WNL; prolactin 3.2; lipid panel unremarkable; TSH 0.189; UA unremarkable; UDS positive for BZO, morphine, oxycodone, THC;    EKG on 9/30: QTc 442     Dispo: Patient interested in transition to residential rehabilitation.   Milbert Coulter, Medical Student 07/14/2023 3:00  PM     Milbert Coulter, Medical Student 07/14/23 [phone]

## 2023-07-14 NOTE — ED Notes (Signed)
Patient complaint of rib pain, this nurse gave patient one of the disposable heating pads

## 2023-07-14 NOTE — ED Notes (Signed)
Patient resting with eyes closed in no apparent acute distress. Respirations even and unlabored. Environment secured. Safety checks in place according to facility policy.

## 2023-07-14 NOTE — Group Note (Signed)
Group Topic: Spirituality in Recovery  Group Date: 07/14/2023 Start Time: 0300 End Time: 0345 Facilitators: Woody Seller, [place]  Department: [facility]  Number of Participants: 5  Group Focus: check in, clarity of thought, daily focus, feeling awareness/expression, forgiveness, goals/reality orientation, healthy friendships, impulsivity, personal responsibility, and self-awareness Treatment Modality:  Behavior Modification Therapy, Cognitive Behavioral Therapy, and Spiritual Interventions utilized were group exercise, story telling, and support Purpose: explore maladaptive thinking, express feelings, improve communication skills, increase insight, regain self-worth, and reinforce self-care  Name: Dakota [name] Date of Birth: March 23, 1996  MR: [id number]    Level of Participation: active Quality of Participation: attentive, cooperative, engaged, and motivated Interactions with others: gave feedback Mood/Affect: positive Triggers (if applicable): n/a Cognition: coherent/clear, goal directed, insightful, and logical Progress: Gaining insight Summary of Patient Progress: Patient actively participated in group on today.  Patient reports that the video clip definitely made him reflect on some areas he can improve on.  Patient reports he would like to pour more into himself than others.   Patient reports he wants to start  taking care of himself for a change.  Patient reports feeling encouraged from watching the video, and his was able to provide feedback to staff and peers.  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

## 2023-07-14 NOTE — Group Note (Signed)
Group Topic: Communication  Group Date: 07/14/2023 Start Time: 2000 End Time: 2030 Facilitators: Rae Lips B  Department: [facility]  Number of Participants: 3  Group Focus: acceptance Treatment Modality:  Individual Therapy Interventions utilized were leisure development Purpose: express feelings  Name: Dakota [name] Date of Birth: 28-Jan-1996  MR: [id number]    Level of Participation: pt did not attend group  Quality of Participation: NA Interactions with others: NA Mood/Affect: NA Triggers (if applicable): NA Cognition: NA Progress: Gaining insight Response: NA Plan: patient will be encouraged to attend groups.   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

## 2023-07-14 NOTE — ED Notes (Signed)
Patient sitting in dayroom interacting with peers. No acute distress noted. No concerns voiced. Patient approached nurses' station to thank staff for caring for him. Staff expressed gratitude and stated how well patient is doing in recovery process. Informed patient to notify staff with any needs or assistance. Patient verbalized understanding or agreement. Safety checks in place per facility policy.

## 2023-07-14 NOTE — ED Notes (Signed)
Pt was provided dinner.

## 2023-07-14 NOTE — ED Notes (Signed)
Writer went to check on pt due to length of time in shower. Pt observed sitting on floor in shower with water running on him. Pt states, "the water makes me feel better". Writer informed pt that other pt's may need to use shower and pt may return to shower at later time. Pt verbalized understanding and stated he would be out momentarily. Informed pt that medication was awaiting him once he got dressed. Pt verbalized understanding. Safety maintained.

## 2023-07-14 NOTE — ED Notes (Signed)
Pt sitting in cafeteria and yelled out to nurse that "I think I'm about to throw up". Staff provided pt with an emesis bag and Writer gave pt a phenergan 25mg  injection to L deltoid. Pt tolerated well. Pt asked for some fresh air. MD took pt outside for fresh air. Pt ate fruit cup, ate dinner roll and drank approx 10oz of fluid. Will continue to monitor for safety.

## 2023-07-15 DIAGNOSIS — F1193 Opioid use, unspecified with withdrawal: Secondary | ICD-10-CM | POA: Diagnosis not present

## 2023-07-15 DIAGNOSIS — F4310 Post-traumatic stress disorder, unspecified: Secondary | ICD-10-CM | POA: Diagnosis not present

## 2023-07-15 DIAGNOSIS — F1124 Opioid dependence with opioid-induced mood disorder: Secondary | ICD-10-CM | POA: Diagnosis not present

## 2023-07-15 DIAGNOSIS — F32A Depression, unspecified: Secondary | ICD-10-CM | POA: Diagnosis not present

## 2023-07-15 DIAGNOSIS — Z560 Unemployment, unspecified: Secondary | ICD-10-CM | POA: Diagnosis not present

## 2023-07-15 DIAGNOSIS — F17200 Nicotine dependence, unspecified, uncomplicated: Secondary | ICD-10-CM | POA: Diagnosis not present

## 2023-07-15 DIAGNOSIS — F1123 Opioid dependence with withdrawal: Secondary | ICD-10-CM | POA: Diagnosis present

## 2023-07-15 DIAGNOSIS — F411 Generalized anxiety disorder: Secondary | ICD-10-CM | POA: Diagnosis not present

## 2023-07-15 DIAGNOSIS — D649 Anemia, unspecified: Secondary | ICD-10-CM | POA: Diagnosis not present

## 2023-07-15 LAB — TSH: TSH: 0.118 u[IU]/mL — ABNORMAL LOW (ref 0.350–4.500)

## 2023-07-15 MED ORDER — ENSURE ENLIVE PO LIQD
237.0000 mL | Freq: Two times a day (BID) | ORAL | Status: DC
  Administered 2023-07-15 – 2023-07-19 (×8): 237 mL via ORAL

## 2023-07-15 MED ORDER — NICOTINE POLACRILEX 2 MG MT GUM
2.0000 mg | CHEWING_GUM | OROMUCOSAL | Status: DC | PRN
  Administered 2023-07-15 – 2023-07-17 (×3): 2 mg via ORAL
  Filled 2023-07-15 (×3): qty 1

## 2023-07-15 NOTE — Group Note (Signed)
Group Topic: Balance in Life  Group Date: 07/15/2023 Start Time: 2000 End Time: 2030 Facilitators: Guss Bunde  Department: [facility]  Number of Participants: 5  Group Focus: affirmation Treatment Modality:  Psychoeducation Interventions utilized were group exercise Purpose: reinforce self-care  Name: Dakota [name] Date of Birth: [birth year]  MR: [id number]    Level of Participation: active Quality of Participation: cooperative Interactions with others: gave feedback Mood/Affect: appropriate Triggers (if applicable):  Cognition: goal directed Progress: Gaining insight Response: Pt wants to make enough money to buy a house and stay focused from things keeping him back Plan: patient will be encouraged to follow up with goals  Patients Problems:  Patient Active Problem List   Diagnosis Date Noted   Opioid dependence with withdrawal (HCC) 07/13/2023   Fentanyl use disorder, severe, 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

## 2023-07-15 NOTE — ED Notes (Signed)
Pt is currently sleeping, no distress noted, environmental check complete, will continue to monitor patient for safety.  

## 2023-07-15 NOTE — ED Notes (Signed)
Patient observed resting quietly, eyes closed. Respirations equal and unlabored. Will continue to monitor for safety.  

## 2023-07-15 NOTE — ED Notes (Signed)
Patient is alert and oriented X 4. He denies SI/HI or AVH. He is calm, cooperative, and interacts with peers and staff appropriately. He denies any pain at this time. Initially patient requested to be discharged today but he then decided that he would stay to complete detox. This nurse offered support and encouragement. Patient denies any needs at this moment. We will continue to monitor for safety.

## 2023-07-15 NOTE — Discharge Planning (Signed)
LCSW received an update from MD that patient is interested in residential placement at this time. Chart reviewed and LCSW will send referrals out for review and will provide updates as received. Patient has [employer]. Patient will be referred to the following facilities: [facility], [facility], [facility] in [place], and [facility] for review.   Fernande Boyden, LCSW Clinical Social Worker [facility] Ph: [phone]

## 2023-07-15 NOTE — ED Provider Notes (Signed)
Behavioral Health Progress Note  Date and Time: 07/15/2023 10:38 AM Name: Dakota Dakota MRN:  [id number]  Subjective:   Dakota Dakota is a 27 year old male with no significant past medical history who has a history of one prior hospitalization for opiate withdrawal (Nov. 2023), who came to the [facility] on 9/30 voluntary by brother from home for complaint of opiate withdrawal and was admitted to [facility] on 10/01 for detox and is seeking residential rehabilitation.  Patient evaluated on the unit. Reports sleep improving overall and helped by melatonin. Reports he is able to eat more and mostly only able to keep fruit down. This morning he reports he is not too hungry. States mood today is "a lot better since I talked to mom yesterday."  He is looking forward to entering  21 day residential program, and mom helped him feel more accepting of plan for treatment. Another goal is trying to get rid of cravings, and in the long run he hopes to break this cycle of addiction. He also reported that yesterday he spent many hours in his room and started to think about past, and he reflected upon the necessity for him to provide more grace for himself. He does not want to waste more time by being high throughout the day. Ultimately, he aims to be better at caring for himself so he can be a present father. Reports goals for today include reading half of a novel, attending all groups, spending more time outside of his room.  Rate depression "not that bad" 5/10; anxiety 7-8/10 unchanged from yesterday, where 10 is most severe.  On interview, suicidal thoughts are not present. Thoughts of self harm are not present. Homicidal ideations are not present. There are no auditory hallucinations, visual hallucinations, paranoid ideations, or delusional thought processes.   Compared to the past two days, patient is more energetic, engaged, and interested in activities he enjoys such as reading and  interacting with others during groups. He also demonstrates improved appetite, concentration, less tremors.   Side effects to currently prescribed medications are none.  - Last night he experienced a mild headache and discomfort in the area under his ribs and around his abdomen alleviated by a heating pad.  - This morning patient reports still experiencing withdrawal symptoms, specifically, chills, sweating, nausea, nasal stuffiness. Denies fever, heartburn, vomiting, diarrhea, back pain, dizziness, tremor, weakness.    Diagnosis:  Final diagnoses:  None    Total Time spent with patient: 1 hour Past Psychiatric Hx:  Patient denies past psychiatric history, rehabilitation. Does not have outpatient follow-up.  Substance Abuse Hx: Alcohol: denies Tobacco: 0.5 pack per day since 27 y.o. Cannabis: denies current use. Substance use: Currently fentanyl, oxycodone by sniffing it.  - Stated weed in his early teens. Began taking opioids since he was a teenager. History of using vicodin, promethazine, codeine, xanax, adderall.  - Used Ecstasy x 6-7 years and quit cold [place] 6 months ago.  - Denies current heroin, meth, IV drug use. Rx drug abuse: Percocet, adderall Rehab hx: None    Past Medical History: Allergies: No known drug allergies Hospitalizations per chart review:             - Hospitalized in 2023: fentanyl and percocet withdrawal, AKI - Hospitalized in 2014: altered mental status and possible seizure activity, UDS positive cannabinoids. Dx as likely a functional disorder with a possible underlying viral syndrome. Normal head CT, CSF study - Previously diagnosed with conversion disorder at 27 y.o.  Seizures: Seizure like activity documented in 2014 and 2020. Described some shaking activity that woke him from his sleep.     Family Medical History: - Family history of heart conditions, hypertension   Family Psychiatric History: Psychiatric Dx: some relatives with unspecified  psychiatric disorder.  - Per chart review, paternal aunt with bipolar II disorder Family substance use: Patient describes multiple relatives have a substance use disorder - Mother - marijuana . 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 daughter Porfirio Mylar, stays with mother; sees pt each weekend. Provides financial support Legal: No charges, court dates Military: None. Access to firearms: Patient reports brother lost his gun. Per brother, none.     Current Medications:  Current Facility-Administered Medications  Medication Dose Route Frequency Provider Last Rate Last Admin   acetaminophen (TYLENOL) tablet 650 mg  650 mg Oral Q6H PRN Rankin, Shuvon B, NP   650 mg at 07/13/23 1816   alum & mag hydroxide-simeth (MAALOX/MYLANTA) 200-200-20 MG/5ML suspension 30 mL  30 mL Oral Q4H PRN Rankin, Shuvon B, NP       buprenorphine (SUBUTEX) SL tablet 2 mg  2 mg Sublingual Q12H Carrion-Carrero, Margely, MD   2 mg at 07/15/23 1610   cloNIDine (CATAPRES) tablet 0.1 mg  0.1 mg Oral QID Rankin, Shuvon B, NP   0.1 mg at 07/15/23 9604   Followed by   cloNIDine (CATAPRES) tablet 0.1 mg  0.1 mg Oral BH-qamhs Rankin, Shuvon B, NP       Followed by   Melene Muller ON 07/18/2023] cloNIDine (CATAPRES) tablet 0.1 mg  0.1 mg Oral QAC breakfast Rankin, Shuvon B, NP       dicyclomine (BENTYL) tablet 20 mg  20 mg Oral Q6H PRN Rankin, Shuvon B, NP       hydrOXYzine (ATARAX) tablet 25 mg  25 mg Oral Q6H PRN Rankin, Shuvon B, NP   25 mg at 07/14/23 2133   loperamide (IMODIUM) capsule 2-4 mg  2-4 mg Oral PRN Rankin, Shuvon B, NP       magnesium hydroxide (MILK OF  MAGNESIA) suspension 30 mL  30 mL Oral Daily PRN Rankin, Shuvon B, NP       melatonin tablet 5 mg  5 mg Oral QHS Sindy Guadeloupe, NP   5 mg at 07/14/23 2259   methocarbamol (ROBAXIN) tablet 500 mg  500 mg Oral Q8H PRN Rankin, Shuvon B, NP   500 mg at 07/14/23 0920   naproxen (NAPROSYN) tablet 500 mg  500 mg Oral BID PRN Rankin, Shuvon B, NP   500 mg at 07/14/23 5409   nicotine polacrilex (NICORETTE) gum 2 mg  2 mg Oral PRN Carrion-Carrero, Karle Starch, MD       promethazine (PHENERGAN) injection 25 mg  25 mg Intramuscular Q8H PRN Carrion-Carrero, Margely, MD   25 mg at 07/14/23 1239   No current outpatient medications on file.    Labs  Lab Results:  Admission on 07/13/2023  Component Date Value Ref Range Status   POC Amphetamine UR 07/14/2023 None Detected  NONE DETECTED (Cut Off Level 1000 ng/mL) Final   POC Secobarbital (BAR) 07/14/2023 None Detected  NONE DETECTED (Cut Off Level 300 ng/mL) Final   POC Buprenorphine (BUP)  07/14/2023 Positive (A)  NONE DETECTED (Cut Off Level 10 ng/mL) Final   POC Oxazepam (BZO) 07/14/2023 None Detected  NONE DETECTED (Cut Off Level 300 ng/mL) Final   POC Cocaine UR 07/14/2023 None Detected  NONE DETECTED (Cut Off Level 300 ng/mL) Final   POC Methamphetamine UR 07/14/2023 None Detected  NONE DETECTED (Cut Off Level 1000 ng/mL) Final   POC Morphine 07/14/2023 None Detected  NONE DETECTED (Cut Off Level 300 ng/mL) Final   POC Methadone UR 07/14/2023 None Detected  NONE DETECTED (Cut Off Level 300 ng/mL) Final   POC Oxycodone UR 07/14/2023 None Detected  NONE DETECTED (Cut Off Level 100 ng/mL) Final   POC Marijuana UR 07/14/2023 None Detected  NONE DETECTED (Cut Off Level 50 ng/mL) Final   TSH 07/15/2023 0.118 (L)  0.350 - 4.500 uIU/mL Final   Comment: Performed by a 3rd Generation assay with a functional sensitivity of <=0.01 uIU/mL. Performed at [name], [address], [place], [place] [address]   Admission on 07/12/2023, Discharged on 07/13/2023   Component Date Value Ref Range Status   Sodium 07/12/2023 141  135 - 145 mmol/L Final   Potassium 07/12/2023 3.7  3.5 - 5.1 mmol/L Final   Chloride 07/12/2023 106  98 - 111 mmol/L Final   CO2 07/12/2023 24  22 - 32 mmol/L Final   Glucose, Bld 07/12/2023 122 (H)  70 - 99 mg/dL Final   Glucose reference range applies only to samples taken after fasting for at least 8 hours.   BUN 07/12/2023 <5 (L)  6 - 20 mg/dL Final   Creatinine, Ser 07/12/2023 1.05  0.61 - 1.24 mg/dL Final   Calcium 60/45/4098 9.5  8.9 - 10.3 mg/dL Final   GFR, Estimated 07/12/2023 >60  >60 mL/min Final   Comment: (NOTE) Calculated using the CKD-EPI Creatinine Equation (2021)    Anion gap 07/12/2023 11  5 - 15 Final   Performed at [name], [address], [place], [place] [address]   Magnesium 07/12/2023 1.9  1.7 - 2.4 mg/dL Final   Performed at [name], [address] [place], [place] [address]  Admission on 07/12/2023, Discharged on 07/12/2023  Component Date Value Ref Range Status   WBC 07/12/2023 7.8  4.0 - 10.5 K/uL Final   RBC 07/12/2023 4.50  4.22 - 5.81 MIL/uL Final   Hemoglobin 07/12/2023 11.5 (L)  13.0 - 17.0 g/dL Final   HCT 62/13/0865 35.3 (L)  39.0 - 52.0 % Final   MCV 07/12/2023 78.4 (L)  80.0 - 100.0 fL Final   MCH 07/12/2023 25.6 (L)  26.0 - 34.0 pg Final   MCHC 07/12/2023 32.6  30.0 - 36.0 g/dL Final   RDW 78/46/9629 14.0  11.5 - 15.5 % Final   Platelets 07/12/2023 405 (H)  150 - 400 K/uL Final   nRBC 07/12/2023 0.0  0.0 - 0.2 % Final   Neutrophils Relative % 07/12/2023 75  % Final   Neutro Abs 07/12/2023 5.9  1.7 - 7.7 K/uL Final   Lymphocytes Relative 07/12/2023 18  % Final   Lymphs Abs 07/12/2023 1.4  0.7 - 4.0 K/uL Final   Monocytes Relative 07/12/2023 4  % Final   Monocytes Absolute 07/12/2023 0.3  0.1 - 1.0 K/uL Final   Eosinophils Relative 07/12/2023 2  % Final   Eosinophils Absolute 07/12/2023 0.1  0.0 - 0.5 K/uL Final   Basophils Relative 07/12/2023 1  % Final    Basophils Absolute 07/12/2023 0.0  0.0 - 0.1 K/uL Final   Immature Granulocytes 07/12/2023 0  % Final   Abs Immature Granulocytes 07/12/2023 0.03  0.00 - 0.07 K/uL Final   Performed at [facility], [address], [place], [place] [address]   Sodium 07/12/2023 138  135 - 145 mmol/L Final   Potassium 07/12/2023 3.6  3.5 - 5.1 mmol/L Final   Chloride 07/12/2023 101  98 - 111 mmol/L Final   CO2 07/12/2023 25  22 - 32 mmol/L Final   Glucose, Bld 07/12/2023 119 (H)  70 - 99 mg/dL Final   Glucose reference range applies only to samples taken after fasting for at least 8 hours.   BUN 07/12/2023 <5 (L)  6 - 20 mg/dL Final   Creatinine, Ser 07/12/2023 0.93  0.61 - 1.24 mg/dL Final   Calcium 95/62/1308 9.6  8.9 - 10.3 mg/dL Final   Total Protein 65/78/4696 8.0  6.5 - 8.1 g/dL Final   Albumin 29/52/8413 4.4  3.5 - 5.0 g/dL Final   AST 24/40/1027 24  15 - 41 U/L Final   ALT 07/12/2023 20  0 - 44 U/L Final   Alkaline Phosphatase 07/12/2023 60  38 - 126 U/L Final   Total Bilirubin 07/12/2023 0.6  0.3 - 1.2 mg/dL Final   GFR, Estimated 07/12/2023 >60  >60 mL/min Final   Comment: (NOTE) Calculated using the CKD-EPI Creatinine Equation (2021)    Anion gap 07/12/2023 12  5 - 15 Final   Performed at [facility], [address], [place], [place] [address]   Hgb A1c MFr Bld 07/12/2023 5.5  4.8 - 5.6 % Final   Comment: (NOTE) Pre diabetes:          5.7%-6.4%  Diabetes:              >6.4%  Glycemic control for   <7.0% adults with diabetes    Mean Plasma Glucose 07/12/2023 111.15  mg/dL Final   Performed at [facility], [address], [place], [place] [address]   Magnesium 07/12/2023 2.0  1.7 - 2.4 mg/dL Final   Performed at [name], [address] [place], [place] [address]   Alcohol, Ethyl (B) 07/12/2023 <10  <10 mg/dL Final   Comment: (NOTE) Lowest detectable limit for serum alcohol is 10 mg/dL.  For medical purposes only. Performed at [name], [address] [place], [place] [address]    Prolactin 07/12/2023 3.2 (L)  3.6 - 31.5 ng/mL Final   Comment: (NOTE) Performed At: [name] [address] [place], [place] [id number] Jolene Schimke MD OA:[phone]    Color, Urine 07/12/2023 YELLOW  YELLOW Final   APPearance 07/12/2023 CLEAR  CLEAR Final   Specific Gravity, Urine 07/12/2023 1.009  1.005 - 1.030 Final   pH 07/12/2023 8.0  5.0 - 8.0 Final   Glucose, UA 07/12/2023 NEGATIVE  NEGATIVE mg/dL Final   Hgb urine dipstick 07/12/2023 NEGATIVE  NEGATIVE Final   Bilirubin Urine 07/12/2023 NEGATIVE  NEGATIVE Final   Ketones, ur 07/12/2023 NEGATIVE  NEGATIVE mg/dL Final   Protein, ur 10/20/3233 NEGATIVE  NEGATIVE mg/dL Final   Nitrite 57/32/2025 NEGATIVE  NEGATIVE Final   Leukocytes,Ua 07/12/2023 NEGATIVE  NEGATIVE Final   Performed at [name], [address], [place], [place] [address]   POC Amphetamine UR 07/12/2023 None Detected  NONE DETECTED (Cut Off Level 1000 ng/mL) Final   POC Secobarbital (BAR) 07/12/2023 None Detected  NONE DETECTED (Cut  Off Level 300 ng/mL) Final   POC Buprenorphine (BUP) 07/12/2023 None Detected  NONE DETECTED (Cut Off Level 10 ng/mL) Final   POC Oxazepam (BZO) 07/12/2023 Positive (A)  NONE DETECTED (Cut Off Level 300 ng/mL) Final   POC Cocaine UR 07/12/2023 None Detected  NONE DETECTED (Cut Off Level 300 ng/mL) Final   POC Methamphetamine UR 07/12/2023 None Detected  NONE DETECTED (Cut Off Level 1000 ng/mL) Final   POC Morphine 07/12/2023 Positive (A)  NONE DETECTED (Cut Off Level 300 ng/mL) Final   POC Methadone UR 07/12/2023 None Detected  NONE DETECTED (Cut Off Level 300 ng/mL) Final   POC Oxycodone UR 07/12/2023 Positive (A)  NONE DETECTED (Cut Off Level 100 ng/mL) Final   POC Marijuana UR 07/12/2023 Positive (A)  NONE DETECTED (Cut Off Level 50 ng/mL) Final   Cholesterol 07/12/2023 142  0 - 200 mg/dL Final   Triglycerides 78/29/5621 57  <150 mg/dL Final   HDL 30/86/5784 56   >40 mg/dL Final   Total CHOL/HDL Ratio 07/12/2023 2.5  RATIO Final   VLDL 07/12/2023 11  0 - 40 mg/dL Final   LDL Cholesterol 07/12/2023 75  0 - 99 mg/dL Final   Comment:        Total Cholesterol/HDL:CHD Risk Coronary Heart Disease Risk Table                     Men   Women  1/2 Average Risk   3.4   3.3  Average Risk       5.0   4.4  2 X Average Risk   9.6   7.1  3 X Average Risk  23.4   11.0        Use the calculated Patient Ratio above and the CHD Risk Table to determine the patient's CHD Risk.        ATP III CLASSIFICATION (LDL):  <100     mg/dL   Optimal  696-295  mg/dL   Near or Above                    Optimal  130-159  mg/dL   Borderline  284-132  mg/dL   High  >440     mg/dL   Very High Performed at [name], [address] [place], [place] [address]    TSH 07/12/2023 0.189 (L)  0.350 - 4.500 uIU/mL Final   Comment: Performed by a 3rd Generation assay with a functional sensitivity of <=0.01 uIU/mL. Performed at [facility], [address] [place], [place] [address]     Blood Alcohol level:  Lab Results  Component Value Date   [name] <10 07/12/2023   ETH <10 09/09/2022    Metabolic Disorder Labs: Lab Results  Component Value Date   HGBA1C 5.5 07/12/2023   MPG 111.15 07/12/2023   Lab Results  Component Value Date   PROLACTIN 3.2 (L) 07/12/2023   Lab Results  Component Value Date   CHOL 142 07/12/2023   TRIG 57 07/12/2023   HDL 56 07/12/2023   CHOLHDL 2.5 07/12/2023   VLDL 11 07/12/2023   LDLCALC 75 07/12/2023   LDLCALC 73 05/27/2021    Therapeutic Lab Levels: No results found for: "LITHIUM" No results found for: "VALPROATE" No results found for: "CBMZ"  Physical Findings   PHQ2-9    Flowsheet Row ED from 07/13/2023 in [facility] Office Visit from 05/27/2021 in [facility]  PHQ-2 Total Score 4 0  PHQ-9 Total Score 15 --      Flowsheet Row ED from 07/13/2023 in  [facility] Most recent reading at 07/13/2023 12:37 PM ED from 07/13/2023 in [facility] Most recent reading at 07/13/2023  2:25 AM ED from 07/12/2023 in [facility] Most recent reading at 07/12/2023  5:38 PM  C-SSRS RISK CATEGORY No Risk No Risk No Risk        Musculoskeletal  Strength & Muscle Tone: within normal limits Gait & Station: normal Patient leans: N/A  Psychiatric Specialty Exam  Presentation  General Appearance:  Appropriate for Environment; Casual; Fairly Groomed  Eye Contact: Minimal  Speech: Clear and Coherent  Speech Volume: Normal  Handedness: Right   Mood and Affect  Mood: -- ("Better than yesterday")  Affect: Appropriate; Full Range; Congruent   Thought Process  Thought Processes: Coherent; Linear  Descriptions of Associations: None  Orientation:Full (Time, Place and Person)  Thought Content:Logical; Abstract Reasoning  Diagnosis of Schizophrenia or Schizoaffective disorder in past: No    Hallucinations:Hallucinations: None  Ideas of Reference:None  Suicidal Thoughts:Suicidal Thoughts: No  Homicidal Thoughts:Homicidal Thoughts: No   Sensorium  Memory: Immediate Good; Recent Good; Remote Good  Judgment: Fair  Insight: Good   Executive Functions  Concentration: Fair  Attention Span: Good  Recall: Good  Fund of Knowledge: Good  Language: Good   Psychomotor Activity  Psychomotor Activity: Psychomotor Activity: Normal   Assets  Assets: Communication Skills; Desire for Improvement; Housing; Resilience; Social Support   Sleep  Sleep: Sleep: Good   Physical Exam  Physical Exam Vitals reviewed.  Constitutional:      General: He is not in acute distress.    Appearance: He is not ill-appearing, toxic-appearing or diaphoretic.  HENT:     Head: Normocephalic and atraumatic.  Pulmonary:     Effort: Pulmonary effort is  normal.  Neurological:     General: No focal deficit present.     Mental Status: He is alert and oriented to person, place, and time.     Motor: No weakness.     Gait: Gait normal.    Review of Systems  Constitutional:  Positive for chills, diaphoresis and malaise/fatigue. Negative for fever.  HENT:  Positive for congestion.   Gastrointestinal:  Positive for nausea. Negative for constipation, diarrhea, heartburn and vomiting.  Musculoskeletal:  Negative for back pain.  Neurological:  Positive for headaches. Negative for dizziness, tremors and weakness.  Psychiatric/Behavioral:  Positive for substance abuse. Negative for hallucinations and suicidal ideas.    Blood pressure 123/76, pulse (!) 55, temperature 98.9 F (37.2 C), temperature source Oral, resp. rate 16, SpO2 100%. There is no height or weight on file to calculate BMI.   ASSESSMENT AND PLAN:   Treatment Plan Summary: Daily contact with patient to assess and evaluate symptoms and progress in treatment and Medication management.  Interaction with patient was initially limited due to ongoing withdrawal symptoms, lethargy, and withdrawing to his bed. Patient continues to have noticeable improvements as his withdrawal symptoms decrease, and he responded well to the initiation of subutex 2 mg BID started on 10/02.     Opioid use disorder - Continue subutex 2 mg BID - Per detox protocol - Continue Clonidine taper - Plan to switch from subutex to suboxone over the weekend - Set patient up with outpatient provider who will manage suboxone  - F/u with patient regarding discarding pill stockpile at home. Pt  plans to call brother   Nicotine use disorder Encourage smoking cessation Switch from nicotine patch 14 mg daily as needed to nicotine gum 2 mg as needed.  -PRNs: Tylenol 650 mg every 6 hours as needed Maalox Mylanta every 4 hours as needed Bentyl every 6 hours as needed Atarax 25 mg every 6 hours as needed Imodium as  needed for diarrhea Milk of Magnesia daily as needed Robaxin 500 mg every 8 hours as needed Naproxen 500 mg twice daily as needed Phenergan injection 25 mg every 8 hours as needed     Labs/Imaging Reviewed: CBC showing microcytic anemia (hemoglobin 11.5, MCV 78.4), platelets 405 K/uL; CMP showing some hyperglycemia glucose 119, otherwise unremarkable; A1c 5.5%; ethanol negative; magnesium WNL; prolactin 3.2; lipid panel unremarkable; TSH 0.189; UA unremarkable; UDS positive for BZO, morphine, oxycodone, THC.   EKG on 9/30: QTc 442     Dispo: Patient interested in transition to residential rehabilitation.   Milbert Coulter, Medical Student 07/15/2023 10:38 AM    Milbert Coulter, Medical Student 07/15/23 1058

## 2023-07-15 NOTE — ED Notes (Signed)
Patient received breakfast. 

## 2023-07-15 NOTE — ED Notes (Signed)
Pt is in the bed reading a book.Pt complained about feeling dizzy, and unable to eat well. Fruits were offered.  Respirations are even and unlabored. No acute distress noted. Will continue to monitor for safety and provide support.

## 2023-07-15 NOTE — Group Note (Signed)
Group Topic: Relapse and Recovery  Group Date: 07/15/2023 Start Time: 1000 End Time: 1030 Facilitators: Merlene Morse, [place]  Department: [facility]  Number of Participants: 5  Group Focus: relapse prevention Treatment Modality:  Behavior Modification Therapy Interventions utilized were group exercise Purpose: express feelings  Name: Dakota [name] Date of Birth: Feb 07, 1996  MR: [id number]    Level of Participation: active Quality of Participation: attentive and cooperative Interactions with others: gave feedback Mood/Affect: appropriate Triggers (if applicable): N/A Cognition: coherent/clear Progress: Moderate Response: N/A Plan: patient will be encouraged to continue active participation in treatment.     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

## 2023-07-15 NOTE — ED Notes (Signed)
Patient reported lightheadedness when he stood up from his bed. States he may have gotten up too fast. Blood pressure 110/59 pulse 61. Patient advised to ask for assistance with ambulation if he should feel lightheaded again. Patient voiced understanding.

## 2023-07-15 NOTE — ED Notes (Signed)
Dinner provided.

## 2023-07-15 NOTE — ED Notes (Signed)
Pt is curled up in a ball and has been all night MHT offered another blanket and I turned the heat up.

## 2023-07-15 NOTE — ED Notes (Signed)
Patient received lunch

## 2023-07-16 DIAGNOSIS — F1123 Opioid dependence with withdrawal: Secondary | ICD-10-CM | POA: Diagnosis not present

## 2023-07-16 DIAGNOSIS — F1193 Opioid use, unspecified with withdrawal: Secondary | ICD-10-CM | POA: Diagnosis not present

## 2023-07-16 DIAGNOSIS — F1994 Other psychoactive substance use, unspecified with psychoactive substance-induced mood disorder: Secondary | ICD-10-CM

## 2023-07-16 NOTE — Group Note (Signed)
Group Topic: Fears and Unhealthy Coping Skills  Group Date: 07/16/2023 Start Time: 1230 End Time: 1250 Facilitators: Jammal Sarr, Jacklynn Barnacle, RN  Department: [facility]  Number of Participants: 4  Group Focus: abuse issues, check in, community group, coping skills, leisure skills, nursing group, and relaxation Treatment Modality:  Patient-Centered Therapy Interventions utilized were exploration, group exercise, and support Purpose: enhance coping skills and relapse prevention strategies  Name: Dakota [name] Date of Birth: [birth year]  MR: [id number]    Level of Participation: active Quality of Participation: attentive, cooperative, engaged, and initiates communication Interactions with others: gave feedback Mood/Affect: appropriate and brightens with interaction Triggers (if applicable): n/a Cognition: coherent/clear Progress: Moderate Response: "I just need to stay busy. I used to draw. I enjoy reading. If I get a job and stay busy I think it'll do me good." Plan: patient will be encouraged to continue on detox/recovery process and use healthy coping skills to assist in prevention of relapse.  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

## 2023-07-16 NOTE — Progress Notes (Signed)
Pt was visible in the milieu and interacted with peers. No distress noted or concerns voiced. Staff will monitor for pt's safety.

## 2023-07-16 NOTE — ED Notes (Signed)
Patient resting with eyes closed in no apparent acute distress. Respirations even and unlabored. Environment secured. Safety checks in place according to facility policy.

## 2023-07-16 NOTE — ED Notes (Addendum)
Patient in the dayroom watching TV with other patients. Respirations are even and unlabored.  Will continue to monitor for safety.

## 2023-07-16 NOTE — Group Note (Signed)
Group Topic: Social Support  Group Date: 07/16/2023 Start Time: 1000 End Time: 1030 Facilitators: Priscille Kluver, NT  Department: [facility]  Number of Participants: 4  Group Focus: community group Treatment Modality:  Solution-Focused Therapy Interventions utilized were support Purpose: enhance coping skills  Name: Dakota [name] Date of Birth: 08-08-1996  MR: [id number]    Level of Participation: moderate Quality of Participation: attentive Interactions with others: gave feedback Mood/Affect: positive Cognition: coherent/clear Progress: Gaining insight Response: Wanting to go to a longer treatment program  Patients Problems:  Patient Active Problem List   Diagnosis Date Noted   Opioid dependence with withdrawal (HCC) 07/13/2023   Fentanyl use disorder, severe, 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

## 2023-07-16 NOTE — Progress Notes (Signed)
Held pt's AM Clonidine dose per P. White, NP's order d/t bradycardia.

## 2023-07-16 NOTE — Discharge Planning (Signed)
Patient was accepted to [facility] in [place], [place] for their PHP with housing option. Agency is able to accept the patient on Monday 07/19/2023. Agency will call back regarding transportation arrangements for patient. Agency is able to continue Suboxone for the patient, he would just need to receive a dose on the day of discharge. Patient reports he has spoken with his family who is supportive of his decision to seek further treatment at this time. Patient reports his father lives in [place] and if needed he will have support there. Patient was appreciative of LCSW assistance with securing placement for him. Patient aware that updates will be provided once received. LCSW did ask agency to send a list of items the patient can and cannot have at the facility via email. No other needs to report. Staff to be made known regarding the plan.  Fernande Boyden, LCSW Clinical Social Worker [facility] Ph: [phone]

## 2023-07-16 NOTE — ED Notes (Signed)
RN Ryta made aware of low heart rate. Scheduled 0800 Clonidine held at this time until further instruction from provider given. RN Ryta informed Clinical research associate she will contact provider regarding abnormal vital reading. Patient calm and composed, no physical distress noted.

## 2023-07-16 NOTE — ED Provider Notes (Signed)
Behavioral Health Progress Note  Date and Time: 07/16/2023 12:43 PM Name: Dakota [name] MRN:  [id number]  Subjective:  Dakota [name] is a 27 year old male with no significant past medical history who has a history of one prior hospitalization for opiate withdrawal (Nov. 2023), who came to the [facility] on 9/30 voluntary by brother from home for complaint of opiate withdrawal and was admitted to [facility] on 10/01 for detox and is seeking residential rehabilitation.   Today, patient states that he is starting to feel better now. He denies SI/HI/AVH. He denies side effects to taking Subutex. He reports improved appetite and states that he just go his appetite back today. He reports improved sleep last night. He describes his mood today as a "little anxious." He states that he is worried about his 28 year old. He states that he decided not to get release to attend her birthday and has decided to go to residential treatment to get better. He reports a long history of opiate abuse dating back to age 69 years old. He states that he may be going to recovery in [place] for 30-45 days. Per Alona Bene, CSW, Arlene has been accepted to [facility] for Monday and they will call back to inform of transport time. He will need a dose of his MAT med prior to discharge. They can provide suboxone as long as it is under 12mg . *Patient states that he did call his brother to removed the pills from the home.   Diagnosis:  Final diagnoses:  Opiate withdrawal (HCC)  Opioid dependence with withdrawal (HCC)  Substance induced mood disorder (HCC)    Total Time spent with patient: 20 minutes  Past Psychiatric History: Patient denies a past psychiatric history.   Past Medical History: Hospitalizations per chart review:             - Hospitalized in 2023: fentanyl and percocet withdrawal, AKI - Hospitalized in 2014: altered mental status and possible seizure activity, UDS positive cannabinoids.  Dx as likely a functional disorder with a possible underlying viral syndrome. Normal head CT, CSF study - Previously diagnosed with conversion disorder at 27 y.o. Seizures: Seizure like activity documented in 2014 and 2020. Described some shaking activity that woke him from his sleep.  Family History: family history of cardiac problems  Family Psychiatric  History: Patient reports that his twin brother committed suicide April 2024 by shooting himself in the head. He reports that his grandfather overdosed on opiate in the bathtub. He reports that his father abused alcohol but stopped drinking. He states that his grandmother used lean and crack.   Social History: Patient resides with this mother, father and brother. He is unemployed. He has a 44 y.o. daughter.   Current Medications:  Current Facility-Administered Medications  Medication Dose Route Frequency Provider Last Rate Last Admin   acetaminophen (TYLENOL) tablet 650 mg  650 mg Oral Q6H PRN Rankin, Shuvon B, NP   650 mg at 07/13/23 1816   alum & mag hydroxide-simeth (MAALOX/MYLANTA) 200-200-20 MG/5ML suspension 30 mL  30 mL Oral Q4H PRN Rankin, Shuvon B, NP       buprenorphine (SUBUTEX) SL tablet 2 mg  2 mg Sublingual Q12H Carrion-Carrero, Margely, MD   2 mg at 07/16/23 0854   dicyclomine (BENTYL) tablet 20 mg  20 mg Oral Q6H PRN Rankin, Shuvon B, NP       feeding supplement (ENSURE ENLIVE / ENSURE PLUS) liquid 237 mL  237 mL Oral BID BM Meryl Dare,  MD   237 mL at 07/16/23 0854   hydrOXYzine (ATARAX) tablet 25 mg  25 mg Oral Q6H PRN Rankin, Shuvon B, NP   25 mg at 07/16/23 0856   loperamide (IMODIUM) capsule 2-4 mg  2-4 mg Oral PRN Rankin, Shuvon B, NP       magnesium hydroxide (MILK OF MAGNESIA) suspension 30 mL  30 mL Oral Daily PRN Rankin, Shuvon B, NP       melatonin tablet 5 mg  5 mg Oral QHS Sindy Guadeloupe, NP   5 mg at 07/15/23 2127   methocarbamol (ROBAXIN) tablet 500 mg  500 mg Oral Q8H PRN Rankin, Shuvon B, NP   500 mg at  07/14/23 0920   naproxen (NAPROSYN) tablet 500 mg  500 mg Oral BID PRN Rankin, Shuvon B, NP   500 mg at 07/14/23 0102   nicotine polacrilex (NICORETTE) gum 2 mg  2 mg Oral PRN Carrion-Carrero, Karle Starch, MD   2 mg at 07/16/23 0935   promethazine (PHENERGAN) injection 25 mg  25 mg Intramuscular Q8H PRN Carrion-Carrero, Karle Starch, MD   25 mg at 07/14/23 1239   No current outpatient medications on file.    Labs  Lab Results:  Admission on 07/13/2023  Component Date Value Ref Range Status   POC Amphetamine UR 07/14/2023 None Detected  NONE DETECTED (Cut Off Level 1000 ng/mL) Final   POC Secobarbital (BAR) 07/14/2023 None Detected  NONE DETECTED (Cut Off Level 300 ng/mL) Final   POC Buprenorphine (BUP) 07/14/2023 Positive (A)  NONE DETECTED (Cut Off Level 10 ng/mL) Final   POC Oxazepam (BZO) 07/14/2023 None Detected  NONE DETECTED (Cut Off Level 300 ng/mL) Final   POC Cocaine UR 07/14/2023 None Detected  NONE DETECTED (Cut Off Level 300 ng/mL) Final   POC Methamphetamine UR 07/14/2023 None Detected  NONE DETECTED (Cut Off Level 1000 ng/mL) Final   POC Morphine 07/14/2023 None Detected  NONE DETECTED (Cut Off Level 300 ng/mL) Final   POC Methadone UR 07/14/2023 None Detected  NONE DETECTED (Cut Off Level 300 ng/mL) Final   POC Oxycodone UR 07/14/2023 None Detected  NONE DETECTED (Cut Off Level 100 ng/mL) Final   POC Marijuana UR 07/14/2023 None Detected  NONE DETECTED (Cut Off Level 50 ng/mL) Final   TSH 07/15/2023 0.118 (L)  0.350 - 4.500 uIU/mL Final   Comment: Performed by a 3rd Generation assay with a functional sensitivity of <=0.01 uIU/mL. Performed at [facility], [address], [place], [place] [address]   Admission on 07/12/2023, Discharged on 07/13/2023  Component Date Value Ref Range Status   Sodium 07/12/2023 141  135 - 145 mmol/L Final   Potassium 07/12/2023 3.7  3.5 - 5.1 mmol/L Final   Chloride 07/12/2023 106  98 - 111 mmol/L Final   CO2 07/12/2023 24  22 - 32 mmol/L Final    Glucose, Bld 07/12/2023 122 (H)  70 - 99 mg/dL Final   Glucose reference range applies only to samples taken after fasting for at least 8 hours.   BUN 07/12/2023 <5 (L)  6 - 20 mg/dL Final   Creatinine, Ser 07/12/2023 1.05  0.61 - 1.24 mg/dL Final   Calcium 64/40/3474 9.5  8.9 - 10.3 mg/dL Final   GFR, Estimated 07/12/2023 >60  >60 mL/min Final   Comment: (NOTE) Calculated using the CKD-EPI Creatinine Equation (2021)    Anion gap 07/12/2023 11  5 - 15 Final   Performed at [facility], [address] [place], [place]  [address]   Magnesium 07/12/2023 1.9  1.7 - 2.4 mg/dL Final   Performed at [facility], [address] [place], [place] [address]  Admission on 07/12/2023, Discharged on 07/12/2023  Component Date Value Ref Range Status   WBC 07/12/2023 7.8  4.0 - 10.5 K/uL Final   RBC 07/12/2023 4.50  4.22 - 5.81 MIL/uL Final   Hemoglobin 07/12/2023 11.5 (L)  13.0 - 17.0 g/dL Final   HCT 09/81/1914 35.3 (L)  39.0 - 52.0 % Final   MCV 07/12/2023 78.4 (L)  80.0 - 100.0 fL Final   MCH 07/12/2023 25.6 (L)  26.0 - 34.0 pg Final   MCHC 07/12/2023 32.6  30.0 - 36.0 g/dL Final   RDW 78/29/5621 14.0  11.5 - 15.5 % Final   Platelets 07/12/2023 405 (H)  150 - 400 K/uL Final   nRBC 07/12/2023 0.0  0.0 - 0.2 % Final   Neutrophils Relative % 07/12/2023 75  % Final   Neutro Abs 07/12/2023 5.9  1.7 - 7.7 K/uL Final   Lymphocytes Relative 07/12/2023 18  % Final   Lymphs Abs 07/12/2023 1.4  0.7 - 4.0 K/uL Final   Monocytes Relative 07/12/2023 4  % Final   Monocytes Absolute 07/12/2023 0.3  0.1 - 1.0 K/uL Final   Eosinophils Relative 07/12/2023 2  % Final   Eosinophils Absolute 07/12/2023 0.1  0.0 - 0.5 K/uL Final   Basophils Relative 07/12/2023 1  % Final   Basophils Absolute 07/12/2023 0.0  0.0 - 0.1 K/uL Final   Immature Granulocytes 07/12/2023 0  % Final   Abs Immature Granulocytes 07/12/2023 0.03  0.00 - 0.07 K/uL Final   Performed at [facility], [address],  [place], [place] [address]   Sodium 07/12/2023 138  135 - 145 mmol/L Final   Potassium 07/12/2023 3.6  3.5 - 5.1 mmol/L Final   Chloride 07/12/2023 101  98 - 111 mmol/L Final   CO2 07/12/2023 25  22 - 32 mmol/L Final   Glucose, Bld 07/12/2023 119 (H)  70 - 99 mg/dL Final   Glucose reference range applies only to samples taken after fasting for at least 8 hours.   BUN 07/12/2023 <5 (L)  6 - 20 mg/dL Final   Creatinine, Ser 07/12/2023 0.93  0.61 - 1.24 mg/dL Final   Calcium 78/46/9629 9.6  8.9 - 10.3 mg/dL Final   Total Protein 52/84/1324 8.0  6.5 - 8.1 g/dL Final   Albumin 40/07/2724 4.4  3.5 - 5.0 g/dL Final   AST 36/64/4034 24  15 - 41 U/L Final   ALT 07/12/2023 20  0 - 44 U/L Final   Alkaline Phosphatase 07/12/2023 60  38 - 126 U/L Final   Total Bilirubin 07/12/2023 0.6  0.3 - 1.2 mg/dL Final   GFR, Estimated 07/12/2023 >60  >60 mL/min Final   Comment: (NOTE) Calculated using the CKD-EPI Creatinine Equation (2021)    Anion gap 07/12/2023 12  5 - 15 Final   Performed at [facility], [address], [place], [place] [address]   Hgb A1c MFr Bld 07/12/2023 5.5  4.8 - 5.6 % Final   Comment: (NOTE) Pre diabetes:          5.7%-6.4%  Diabetes:              >6.4%  Glycemic control for   <7.0% adults with diabetes    Mean Plasma Glucose 07/12/2023 111.15  mg/dL Final   Performed at [facility], [address] [place], [place] [address]   Magnesium 07/12/2023 2.0  1.7 - 2.4 mg/dL Final   Performed at [facility], [address], [place], [place] [address]   Alcohol, Ethyl (B) 07/12/2023 <10  <10 mg/dL Final   Comment: (NOTE) Lowest detectable limit for serum alcohol is 10 mg/dL.  For medical purposes only. Performed at [facility], [address] [place], [place] [address]    Prolactin 07/12/2023 3.2 (L)  3.6 - 31.5 ng/mL Final   Comment: (NOTE) Performed At: [facility] [address] [place], [place] [id number] Jolene Schimke MD ZD:[id number]     Color, Urine 07/12/2023 YELLOW  YELLOW Final   APPearance 07/12/2023 CLEAR  CLEAR Final   Specific Gravity, Urine 07/12/2023 1.009  1.005 - 1.030 Final   pH 07/12/2023 8.0  5.0 - 8.0 Final   Glucose, UA 07/12/2023 NEGATIVE  NEGATIVE mg/dL Final   Hgb urine dipstick 07/12/2023 NEGATIVE  NEGATIVE Final   Bilirubin Urine 07/12/2023 NEGATIVE  NEGATIVE Final   Ketones, ur 07/12/2023 NEGATIVE  NEGATIVE mg/dL Final   Protein, ur 59/56/3875 NEGATIVE  NEGATIVE mg/dL Final   Nitrite 64/33/2951 NEGATIVE  NEGATIVE Final   Leukocytes,Ua 07/12/2023 NEGATIVE  NEGATIVE Final   Performed at [facility], [address] [place], [place] [address]   POC Amphetamine UR 07/12/2023 None Detected  NONE DETECTED (Cut Off Level 1000 ng/mL) Final   POC Secobarbital (BAR) 07/12/2023 None Detected  NONE DETECTED (Cut Off Level 300 ng/mL) Final   POC Buprenorphine (BUP) 07/12/2023 None Detected  NONE DETECTED (Cut Off Level 10 ng/mL) Final   POC Oxazepam (BZO) 07/12/2023 Positive (A)  NONE DETECTED (Cut Off Level 300 ng/mL) Final   POC Cocaine UR 07/12/2023 None Detected  NONE DETECTED (Cut Off Level 300 ng/mL) Final   POC Methamphetamine UR 07/12/2023 None Detected  NONE DETECTED (Cut Off Level 1000 ng/mL) Final   POC Morphine 07/12/2023 Positive (A)  NONE DETECTED (Cut Off Level 300 ng/mL) Final   POC Methadone UR 07/12/2023 None Detected  NONE DETECTED (Cut Off Level 300 ng/mL) Final   POC Oxycodone UR 07/12/2023 Positive (A)  NONE DETECTED (Cut Off Level 100 ng/mL) Final   POC Marijuana UR 07/12/2023 Positive (A)  NONE DETECTED (Cut Off Level 50 ng/mL) Final   Cholesterol 07/12/2023 142  0 - 200 mg/dL Final   Triglycerides 60/63/0160 57  <150 mg/dL Final   HDL 10/93/2355 56  >40 mg/dL Final   Total CHOL/HDL Ratio 07/12/2023 2.5  RATIO Final   VLDL 07/12/2023 11  0 - 40 mg/dL Final   LDL Cholesterol 07/12/2023 75  0 - 99 mg/dL Final   Comment:        Total Cholesterol/HDL:CHD Risk Coronary Heart  Disease Risk Table                     Men   Women  1/2 Average Risk   3.4   3.3  Average Risk       5.0   4.4  2 X Average Risk   9.6   7.1  3 X Average Risk  23.4   11.0        Use the calculated Patient Ratio above and the CHD Risk Table to determine the patient's CHD Risk.        ATP III CLASSIFICATION (LDL):  <100     mg/dL   Optimal  732-202  mg/dL   Near or  Above                    Optimal  130-159  mg/dL   Borderline  161-096  mg/dL   High  >045     mg/dL   Very High Performed at [facility], [address], [place], [place] [address]    TSH 07/12/2023 0.189 (L)  0.350 - 4.500 uIU/mL Final   Comment: Performed by a 3rd Generation assay with a functional sensitivity of <=0.01 uIU/mL. Performed at [facility], [address], [place], [place] [address]     Blood Alcohol level:  Lab Results  Component Value Date   [facility] <10 07/12/2023   ETH <10 09/09/2022    Metabolic Disorder Labs: Lab Results  Component Value Date   HGBA1C 5.5 07/12/2023   MPG 111.15 07/12/2023   Lab Results  Component Value Date   PROLACTIN 3.2 (L) 07/12/2023   Lab Results  Component Value Date   CHOL 142 07/12/2023   TRIG 57 07/12/2023   HDL 56 07/12/2023   CHOLHDL 2.5 07/12/2023   VLDL 11 07/12/2023   LDLCALC 75 07/12/2023   LDLCALC 73 05/27/2021    Therapeutic Lab Levels: No results found for: "LITHIUM" No results found for: "VALPROATE" No results found for: "CBMZ"  Physical Findings   PHQ2-9    Flowsheet Row ED from 07/13/2023 in [facility] Office Visit from 05/27/2021 in [facility]  PHQ-2 Total Score 4 0  PHQ-9 Total Score 15 --      Flowsheet Row ED from 07/13/2023 in [facility] Most recent reading at 07/13/2023 12:37 PM ED from 07/13/2023 in [facility] Most recent reading at 07/13/2023  2:25 AM ED from 07/12/2023 in [facility] Most recent reading at 07/12/2023  5:38 PM  C-SSRS RISK CATEGORY No Risk No Risk No Risk        Musculoskeletal  Strength & Muscle Tone: within normal limits Gait & Station: normal Patient leans: N/A  Psychiatric Specialty Exam  Presentation  General Appearance:  Appropriate for Environment  Eye Contact: Fair  Speech: Clear and Coherent  Speech Volume: Normal  Handedness: Right   Mood and Affect  Mood: Anxious  Affect: Congruent   Thought Process  Thought Processes: Coherent; Goal Directed  Descriptions of Associations:Intact  Orientation:Full (Time, Place and Person)  Thought Content:Logical  Diagnosis of Schizophrenia or Schizoaffective disorder in past: No    Hallucinations:Hallucinations: None  Ideas of Reference:None  Suicidal Thoughts:Suicidal Thoughts: No  Homicidal Thoughts:Homicidal Thoughts: No   Sensorium  Memory: Immediate Fair; Recent Fair; Remote Fair  Judgment: Fair  Insight: Fair   Chartered certified accountant: Fair  Attention Span: Fair  Recall: [employer] of Knowledge: Fair  Language: Fair   Psychomotor Activity  Psychomotor Activity: Psychomotor Activity: Normal   Assets  Assets: Communication Skills; Desire for Improvement; Financial Resources/Insurance; Housing; Leisure Time; Physical Health; Social Support   Sleep  Sleep: Sleep: Fair   Physical Exam  Physical Exam HENT:     Head: Normocephalic.     Nose: Nose normal.  Eyes:     Conjunctiva/sclera: Conjunctivae normal.  Pulmonary:     Effort: Pulmonary effort is normal.  Musculoskeletal:        General: Normal range of motion.     Cervical back: Normal range of motion.  Neurological:     Mental Status:  He is alert and oriented to person, place, and time.    Review of Systems  Constitutional: Negative.   HENT: Negative.    Eyes: Negative.   Respiratory: Negative.    Cardiovascular: Negative.    Gastrointestinal: Negative.   Genitourinary: Negative.   Musculoskeletal: Negative.   Neurological: Negative.   Endo/Heme/Allergies: Negative.    Blood pressure 119/69, pulse 60, temperature 98.7 F (37.1 C), temperature source Oral, resp. rate 16, SpO2 100%. There is no height or weight on file to calculate BMI.  Treatment Plan Summary:  Plan of care on 07/15/23 Opioid use disorder - Continue subutex 2 mg BID - Per detox protocol - Continue Clonidine taper - Plan to switch from subutex to suboxone over the weekend - Set patient up with outpatient provider who will manage suboxone  - F/u with patient regarding discarding pill stockpile at home. Pt plans to call brother  Treatment modifications on 07/15/22: Will discontinue clonidine as patient is prescribed MAT - subutex 2 mg po BID for opioid dependence    Dispo: Per Alona Bene, CSW, Chae has been accepted to [facility] for Monday and they will call back to inform of transport time. He will need a dose of his MAT med prior to discharge.  Layla Barter, NP 07/16/2023 12:43 PM

## 2023-07-16 NOTE — ED Notes (Signed)
Patient is sleeping. Respirations equal and unlabored, skin warm and dry. No change in assessment or acuity. Routine safety checks conducted according to facility protocol. Will continue to monitor for safety.   

## 2023-07-16 NOTE — ED Notes (Signed)
Patient is sleeping. Respirations equal and unlabored, skin warm and dry, NAD. No change in assessment or acuity. Routine safety checks conducted according to facility protocol. Will continue to monitor for safety.   

## 2023-07-16 NOTE — Group Note (Signed)
Group Topic: Communication  Group Date: 07/16/2023 Start Time: 2016 End Time: 2117 Facilitators: Rae Lips B  Department: [facility]  Number of Participants: 3  Group Focus: acceptance Treatment Modality:  Individual Therapy Interventions utilized were story telling and support Purpose: regain self-worth  Name: Dakota [name] Date of Birth: Aug 23, 1996  MR: [id number]    Level of Participation: active Quality of Participation: attentive, cooperative, offered feedback, and supportive Interactions with others: gave feedback Mood/Affect: appropriate, brightens with interaction, and positive Triggers (if applicable): being around drugs and people.  Cognition: coherent/clear and insightful Progress: Significant Response: He hopes to come out of this and stay off drugs. He's got his daughter to live for. He said it feels great to finally be sober.  Plan: patient will be encouraged to keep going to groups.   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

## 2023-07-16 NOTE — ED Notes (Signed)
Patient alert & oriented x4. Denies intent to harm self or others when asked. Denies A/VH. Verbally contracted for safety. Patient reports anxiety at 7/10, PRN Atarax given. Denies detox symptoms. No further complaints at this time. No acute distress noted. Support and encouragement provided. Routine safety checks conducted per facility protocol. Encouraged patient to notify staff if any thoughts of harm towards self or others arise. Patient verbalizes understanding and agreement.

## 2023-07-16 NOTE — Discharge Instructions (Addendum)
Patient will be discharging to [facility] on Monday 07/19/2023 with transportation provided via [place].  Location of facility is [address] [place], [place] [address]   [facility] [address], [place], [address] [phone] phone  New Patient Assessment/Therapy Walk-Ins:  Monday and Wednesday: 8 am until slots are full. Every 1st and 2nd Fridays of the month: 1 pm - 5 pm.  NO ASSESSMENT/THERAPY WALK-INS ON TUESDAYS OR THURSDAYS  New Patient Assessment/Medication Management Walk-Ins:  Monday - Friday:  8 am - 11 am.  For all walk-ins, we ask that you arrive by 7:30 am because patients will be seen in the order of arrival.  Availability is limited; therefore, you may not be seen on the same day that you walk-in.  Our goal is to serve and meet the needs of our community to the best of our ability.  SUBSTANCE USE TREATMENT for Medicaid and State Funded/IPRS  [facility] [address], [place], [address] [phone] phone NOTE: [facility] is no longer offering IOP services.  Serves those who are low-income or have no insurance.  [facility] [address], [place], [place], [address] [phone] phone [phone] fax NOTE: Does have Substance Abuse-Intensive Outpatient Program [facility]) as well as transitional housing if eligible.  [facility] [address] [place], [place], [address] [phone] phone [phone] fax  [facility] [id number] [place], [place], [address] [phone] phone [phone] fax  HALFWAY HOUSES:  [facility] [phone]  [employer].[email or web address]  12 STEP PROGRAMS:  Alcoholics Anonymous of [place] Software[employer]alet.be  Narcotics Anonymous of [place] [employer]ct.dk  Al-Anon of [place], [place] www.[place]alanon.org/find-meetings.html  Nar-Anon  https://nar-anon.org/find-a-meetin  List of Residential placements:   [facility] in [place]: [phone]  [facility]: [phone]  [place], [place] [phone]: Male and male facility; 30-day program: (uninsured and Medicaid such as Laurena Bering, [facility], [facility], partners)  [facility]: [phone]; men and women's facility; 28 days; Can have Medicaid tailored plan Tour manager or Partners)  [facility]: [phone] Karoline Caldwell or Larita Fife; 28 day program; must be fully detox; tailored Medicaid or no insurance  [address] in [place], [place]; [phone]; 28 day all males program; no insurance accepted  [facility] in [place]: Gabriel Rung [phone] (no insurance or Medicaid only); 90 days; outpatient services but provide housing in apartments downtown [place]  [facility] Admission: [phone]: Patient must complete phone screening for placement: [facility], [place]; 6 month program; uninsured, Medicaid, and [employer].   [facility]: no insurance required; [phone]  [facility]: [phone]; Intake: Molly Maduro; Must fill out application online; Alecia Lemming Delay [phone] x [address] [facility] in [place], [place]: [phone]; Admissions Coordinators Mr. Maurine Minister or Barron Alvine; 90 day program.  [facility]: [place], [place] [phone]; Co-Ed 9 month to a year program; Online application; Men entry fee is $500 (6-91months);  [employer]: [address] [place], [place] [address]; no fee or insurance required; minimum of 2 years; Highly structured; work based; Intake Coordinator is Thayer Ohm [phone]  [facility] in [place], [place]: [phone]; Fax number is [phone]; website: www.[facility].org; Requires 3-6 page autobiography; 2 year program (18 months and then 73month transitional housing); Admission fee is $300; no insurance needed; work  Automotive engineer in [place], [place]: [employer] Desk Staff: Danise Edge [phone]: They have a Men's Regenerations Program 6-62months. Free program; There is an initial $300 fee however, they are  willing to work with patients regarding that. Application is online.  [facility]: Admissions [phone] Doran Heater ext [phone]; Any 7-90 day program is out of pocket; 12 month program is free of charge; there is a $275 entry fee; Patient is responsible for own transportation

## 2023-07-17 DIAGNOSIS — F17200 Nicotine dependence, unspecified, uncomplicated: Secondary | ICD-10-CM | POA: Diagnosis present

## 2023-07-17 DIAGNOSIS — F411 Generalized anxiety disorder: Secondary | ICD-10-CM | POA: Diagnosis present

## 2023-07-17 DIAGNOSIS — F4310 Post-traumatic stress disorder, unspecified: Secondary | ICD-10-CM | POA: Diagnosis present

## 2023-07-17 DIAGNOSIS — F1193 Opioid use, unspecified with withdrawal: Secondary | ICD-10-CM | POA: Diagnosis not present

## 2023-07-17 MED ORDER — SERTRALINE HCL 50 MG PO TABS
50.0000 mg | ORAL_TABLET | Freq: Every morning | ORAL | Status: DC
  Administered 2023-07-18 – 2023-07-19 (×2): 50 mg via ORAL
  Filled 2023-07-17 (×2): qty 1

## 2023-07-17 MED ORDER — NAPROXEN 500 MG PO TABS: 500.0000 mg | ORAL_TABLET | Freq: Two times a day (BID) | ORAL | Status: DC | PRN

## 2023-07-17 MED ORDER — HYDROXYZINE HCL 25 MG PO TABS
50.0000 mg | ORAL_TABLET | Freq: Four times a day (QID) | ORAL | Status: DC | PRN
  Administered 2023-07-18: 50 mg via ORAL
  Filled 2023-07-17: qty 2

## 2023-07-17 MED ORDER — DICYCLOMINE HCL 20 MG PO TABS: 20.0000 mg | ORAL_TABLET | Freq: Four times a day (QID) | ORAL | Status: DC | PRN

## 2023-07-17 MED ORDER — METHOCARBAMOL 500 MG PO TABS: 500.0000 mg | ORAL_TABLET | Freq: Three times a day (TID) | ORAL | Status: DC | PRN

## 2023-07-17 MED ORDER — CLONIDINE HCL 0.1 MG PO TABS: 0.1000 mg | ORAL_TABLET | Freq: Three times a day (TID) | ORAL | Status: DC

## 2023-07-17 MED ORDER — HYDROXYZINE HCL 25 MG PO TABS: 50.0000 mg | ORAL_TABLET | Freq: Four times a day (QID) | ORAL | Status: AC | PRN

## 2023-07-17 MED ORDER — LOPERAMIDE HCL 2 MG PO CAPS: 2.0000 mg | ORAL_CAPSULE | ORAL | Status: DC | PRN

## 2023-07-17 MED ORDER — SERTRALINE HCL 25 MG PO TABS
25.0000 mg | ORAL_TABLET | Freq: Every day | ORAL | Status: AC
  Administered 2023-07-17: 25 mg via ORAL
  Filled 2023-07-17: qty 1

## 2023-07-17 MED ORDER — TRAZODONE HCL 50 MG PO TABS: 50.0000 mg | ORAL_TABLET | Freq: Every day | ORAL | Status: DC

## 2023-07-17 MED ORDER — TRAZODONE HCL 50 MG PO TABS
50.0000 mg | ORAL_TABLET | Freq: Every evening | ORAL | Status: DC | PRN
  Administered 2023-07-17 – 2023-07-19 (×3): 50 mg via ORAL
  Filled 2023-07-17: qty 1

## 2023-07-17 MED ORDER — BUPRENORPHINE HCL-NALOXONE HCL 2-0.5 MG SL SUBL
1.0000 | SUBLINGUAL_TABLET | Freq: Two times a day (BID) | SUBLINGUAL | Status: DC
  Administered 2023-07-17 – 2023-07-19 (×4): 1 via SUBLINGUAL
  Filled 2023-07-17 (×4): qty 1

## 2023-07-17 MED ORDER — CLONIDINE HCL 0.1 MG PO TABS
0.1000 mg | ORAL_TABLET | Freq: Three times a day (TID) | ORAL | Status: DC
  Administered 2023-07-17 – 2023-07-19 (×6): 0.1 mg via ORAL
  Filled 2023-07-17 (×6): qty 1

## 2023-07-17 NOTE — ED Notes (Signed)
Patient states that his pain in his shoulder is dull . States that he has had pain since he started withdrawing from fentanyl.  Provider was present when patient was rating his pain. Will continue to monitor. No new orders were given

## 2023-07-17 NOTE — ED Notes (Signed)
Patient  sleeping in no acute stress. RR even and unlabored .Environment secured .Will continue to monitor for safely. 

## 2023-07-17 NOTE — ED Notes (Signed)
 Patient in the bedroom sleeping. NAD.  Respirations are even and unlabored. Will continue to monitor for safety.

## 2023-07-17 NOTE — ED Notes (Signed)
Patient in the Dayroom watching TV with other patients. Denies SI/HI &AVH. NAD. Respirations are even and unlabored. Will continue to monitor for safety.

## 2023-07-17 NOTE — ED Notes (Signed)
Patient in milieu. Environment is secured. Will continue to monitor for safety. 

## 2023-07-17 NOTE — ED Notes (Signed)
Patient states that the aniexty medication helped

## 2023-07-17 NOTE — ED Notes (Signed)
Patient alert and oriented x 3. Denies SI/HI/AVH. Denies intent or plan to harm self or others. Routine conducted according to faculty protocol. Encourage patient to notify staff with any needs or concerns. Patient verbalized agreement and understanding. Will continue to monitor for safety. 

## 2023-07-17 NOTE — ED Notes (Signed)
Pt was provided dinner.

## 2023-07-17 NOTE — ED Notes (Signed)
Patient requested tylenol for shoulder pain

## 2023-07-17 NOTE — Group Note (Signed)
Group Topic: Communication  Group Date: 07/17/2023 Start Time: 1104 End Time: 1126 Facilitators: Merrie Roof, RN  Department: [facility]  Number of Participants: 6  Group Focus: communication Treatment Modality:  Patient-Centered Therapy Interventions utilized were group exercise Purpose: express feelings  Name: Dakota [name] Date of Birth: 1996-04-20  MR: [id number]    Level of Participation: active Quality of Participation: cooperative Interactions with others: gave feedback Mood/Affect: appropriate Triggers (if applicable): na Cognition: coherent/clear Progress: Significant Response: good Plan: patient will be encouraged to continue therapy   Patients Problems:  Patient Active Problem List   Diagnosis Date Noted   Opioid dependence with withdrawal (HCC) 07/13/2023   Fentanyl use disorder, severe, 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

## 2023-07-17 NOTE — ED Notes (Signed)
Patient in the bedroom composed and resting. NAD. Respirations are even and unlabored. Will continue to monitor for safety.

## 2023-07-17 NOTE — ED Notes (Signed)
Patient requested medication for aniexty. Atarax was given

## 2023-07-17 NOTE — Group Note (Signed)
Group Topic: Relapse and Recovery  Group Date: 07/17/2023 Start Time: 2000 End Time: 2100 Facilitators: Rae Lips B  Department: [facility]  Number of Participants: 6  Group Focus: activities of daily living skills, chemical dependency education, and chemical dependency issues Treatment Modality:  Leisure Development and Spiritual Interventions utilized were problem solving, reality testing, story telling, and support Purpose: express feelings  Name: Dakota [name] Date of Birth: 09-11-1996  MR: [id number]    Level of Participation: active Quality of Participation: attentive, cooperative, and initiates communication Interactions with others: gave feedback Mood/Affect: appropriate, brightens with interaction, and positive Triggers (if applicable): NA Cognition: coherent/clear Progress: Significant Response: NA Plan: patient will be encouraged to keep going to groups.  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

## 2023-07-17 NOTE — ED Notes (Signed)
 Pt was provided lunch

## 2023-07-17 NOTE — ED Notes (Signed)
Patient is requesting aniexty medication . Rn notified provider. Rn also notifed provider that patient had atarax 25mg  @  942 am.
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
## 2023-07-17 NOTE — ED Notes (Addendum)
Patient is outside with staff. Environment is secured. Will continue to monitor for safety.

## 2023-07-17 NOTE — Group Note (Signed)
Group Topic: Relaxation  Group Date: 07/17/2023 Start Time: 1130 End Time: 1210 Facilitators: Londell Moh, NT  Department: [facility]  Number of Participants: 7  Group Focus: check in, daily focus, and relaxation Treatment Modality:  Psychoeducation Interventions utilized were leisure development and patient education Purpose: increase insight  Name: Dakota [name] Date of Birth: June 11, 1996  MR: [id number]    Level of Participation: active Quality of Participation: engaged Interactions with others: gave feedback Mood/Affect: appropriate Triggers (if applicable): n/a Cognition: coherent/clear Progress: Gaining insight Response: Pt was active during group and was able to express his goals for the day Plan: patient will be encouraged to continue to attend groups  Patients Problems:  Patient Active Problem List   Diagnosis Date Noted   Opioid dependence with withdrawal (HCC) 07/13/2023   Fentanyl use disorder, severe, 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

## 2023-07-18 DIAGNOSIS — F1123 Opioid dependence with withdrawal: Secondary | ICD-10-CM | POA: Diagnosis not present

## 2023-07-18 DIAGNOSIS — F1193 Opioid use, unspecified with withdrawal: Secondary | ICD-10-CM | POA: Diagnosis not present

## 2023-07-18 LAB — TSH: TSH: 0.772 u[IU]/mL (ref 0.350–4.500)

## 2023-07-18 LAB — CBC WITH DIFFERENTIAL/PLATELET
Abs Immature Granulocytes: 0.03 10*3/uL (ref 0.00–0.07)
Basophils Absolute: 0.1 10*3/uL (ref 0.0–0.1)
Basophils Relative: 1 %
Eosinophils Absolute: 0.3 10*3/uL (ref 0.0–0.5)
Eosinophils Relative: 3 %
HCT: 30.9 % — ABNORMAL LOW (ref 39.0–52.0)
Hemoglobin: 9.6 g/dL — ABNORMAL LOW (ref 13.0–17.0)
Immature Granulocytes: 0 %
Lymphocytes Relative: 47 %
Lymphs Abs: 3.6 10*3/uL (ref 0.7–4.0)
MCH: 24.4 pg — ABNORMAL LOW (ref 26.0–34.0)
MCHC: 31.1 g/dL (ref 30.0–36.0)
MCV: 78.6 fL — ABNORMAL LOW (ref 80.0–100.0)
Monocytes Absolute: 0.7 10*3/uL (ref 0.1–1.0)
Monocytes Relative: 9 %
Neutro Abs: 3.1 10*3/uL (ref 1.7–7.7)
Neutrophils Relative %: 40 %
Platelets: 352 10*3/uL (ref 150–400)
RBC: 3.93 MIL/uL — ABNORMAL LOW (ref 4.22–5.81)
RDW: 14.9 % (ref 11.5–15.5)
WBC: 7.8 10*3/uL (ref 4.0–10.5)
nRBC: 0 % (ref 0.0–0.2)

## 2023-07-18 LAB — IRON AND TIBC
Iron: 66 ug/dL (ref 45–182)
Saturation Ratios: 20 % (ref 17.9–39.5)
TIBC: 337 ug/dL (ref 250–450)
UIBC: 271 ug/dL

## 2023-07-18 LAB — FERRITIN: Ferritin: 70 ng/mL (ref 24–336)

## 2023-07-18 LAB — VITAMIN B12: Vitamin B-12: 466 pg/mL (ref 180–914)

## 2023-07-18 LAB — T4, FREE: Free T4: 0.84 ng/dL (ref 0.61–1.12)

## 2023-07-18 LAB — FOLATE: Folate: 14.1 ng/mL (ref >5.9)

## 2023-07-18 MED ORDER — BUPRENORPHINE HCL-NALOXONE HCL 2-0.5 MG SL SUBL: 1.0000 | SUBLINGUAL_TABLET | Freq: Two times a day (BID) | SUBLINGUAL | 0 refills | Status: DC

## 2023-07-18 MED ORDER — CLONIDINE HCL 0.1 MG PO TABS: 0.1000 mg | ORAL_TABLET | Freq: Three times a day (TID) | ORAL | 0 refills | Status: DC

## 2023-07-18 MED ORDER — HYDROXYZINE HCL 50 MG PO TABS: 50.0000 mg | ORAL_TABLET | Freq: Three times a day (TID) | ORAL | 0 refills | Status: AC | PRN

## 2023-07-18 MED ORDER — NICOTINE 14 MG/24HR TD PT24
14.0000 mg | MEDICATED_PATCH | Freq: Every day | TRANSDERMAL | Status: DC
  Administered 2023-07-18 – 2023-07-19 (×2): 14 mg via TRANSDERMAL
  Filled 2023-07-18 (×2): qty 1

## 2023-07-18 MED ORDER — ENSURE ENLIVE PO LIQD: 237.0000 mL | Freq: Two times a day (BID) | ORAL | 0 refills | Status: AC

## 2023-07-18 MED ORDER — TRAZODONE HCL 50 MG PO TABS: 50.0000 mg | ORAL_TABLET | Freq: Every evening | ORAL | 0 refills | Status: AC | PRN

## 2023-07-18 MED ORDER — SERTRALINE HCL 50 MG PO TABS: 50.0000 mg | ORAL_TABLET | Freq: Every morning | ORAL | 0 refills | Status: AC

## 2023-07-18 MED ORDER — NICOTINE POLACRILEX 2 MG MT GUM: 2.0000 mg | CHEWING_GUM | OROMUCOSAL | 0 refills | Status: AC | PRN

## 2023-07-18 MED ORDER — NICOTINE 14 MG/24HR TD PT24: 14.0000 mg | MEDICATED_PATCH | Freq: Every day | TRANSDERMAL | 0 refills | Status: AC

## 2023-07-18 MED ORDER — HYDROXYZINE HCL 25 MG PO TABS: 50.0000 mg | ORAL_TABLET | ORAL | Status: DC | PRN

## 2023-07-18 MED ORDER — MELATONIN 5 MG PO TABS: 5.0000 mg | ORAL_TABLET | Freq: Every day | ORAL | 0 refills | Status: AC

## 2023-07-18 NOTE — Group Note (Signed)
Group Topic: Communication  Group Date: 07/18/2023 Start Time: 2000 End Time: 2100 Facilitators: Rae Lips B  Department: [facility]  Number of Participants: 5  Group Focus: acceptance, activities of daily living skills, chemical dependency education, chemical dependency issues, clarity of thought, co-dependency, communication, community group, and coping skills Treatment Modality:  Leisure Development Interventions utilized were leisure development Purpose: express feelings  Name: Dakota [name] Date of Birth: 01/08/1996  MR: [id number]    Level of Participation: active Quality of Participation: attention seeking, attentive, cooperative, engaged, initiates communication, motivated, offered feedback, and supportive Interactions with others: gave feedback Mood/Affect: appropriate Triggers (if applicable): NA Cognition: coherent/clear and goal directed Progress: Gaining insight Response: NA Plan: patient will be encouraged to keep going to groups.   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

## 2023-07-18 NOTE — Group Note (Signed)
Group Topic: Overcoming Obstacles  Group Date: 07/18/2023 Start Time: 1000 End Time: 1100 Facilitators: Ninfa Linden, NT+3 MHT 2  Department: [facility]  Number of Participants: 5  Group Focus: coping skills Treatment Modality:  Behavior Modification Therapy Interventions utilized were problem solving Purpose: trigger / craving management  Name: Dakota [name] Date of Birth: 1996/04/28  MR: [id number]    Level of Participation: active Quality of Participation: initiates communication Interactions with others: gave feedback Mood/Affect: brightens with interaction Triggers (if applicable): Any thing can trigger him sometimes. Cognition: goal directed Progress: Significant Response: He wants to be a better father, and he is willing to work hard. Plan: patient will be encouraged to keep fighting to stay sober and spend more time with his family, and work on himself.  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

## 2023-07-18 NOTE — ED Notes (Signed)
 Patient in the bedroom sleeping. NAD.  Respirations are even and unlabored. Will continue to monitor for safety.

## 2023-07-18 NOTE — ED Notes (Signed)
Pt is in the dayroom watching TV with peers. Pt denies SI/HI/AVH. No acute distress noted. Will continue to monitor for safety. 

## 2023-07-18 NOTE — ED Notes (Signed)
Patient observed resting quietly, eyes closed. Respirations equal and unlabored. Will continue to monitor for safety.  

## 2023-07-18 NOTE — ED Notes (Signed)
Pt is in the bed resting. Respirations are even and unlabored. No acute distress noted. Will continue to monitor for safety

## 2023-07-18 NOTE — ED Notes (Signed)
Patient A&Ox4. Denies intent to harm self/others when asked. Denies SI or A/VH. Patient denies any physical complaints. No acute distress observed. Routine safety checks conducted according to facility protocol. Patient agreed to notify staff if thoughts of harm toward self or others arise. Will continue to monitor for safety.

## 2023-07-18 NOTE — ED Notes (Signed)
Patient observed in the dining room coloring. Throughout the day patient has been calm and cooperative. He has taken his medications with incidence. He denies any additional needs at this time. We will continue to monitor for safety.

## 2023-07-19 DIAGNOSIS — F1193 Opioid use, unspecified with withdrawal: Secondary | ICD-10-CM | POA: Diagnosis not present

## 2023-07-19 MED ORDER — CLONIDINE HCL 0.1 MG PO TABS: 0.1000 mg | ORAL_TABLET | Freq: Three times a day (TID) | ORAL | 0 refills | Status: AC | PRN

## 2023-07-19 NOTE — Discharge Planning (Signed)
LCSW followed up with Admissions Coordinator Jake regarding patient transfer. Per Leta Jungling, the patient will be transported at 11:00am by a [place] staff member. Patient will need to receive his last dose of suboxone prior to discharging from [facility], and medications are to be clearly stated on discharge paperwork for continuation of MAT. Patient was informed of transport time and reports appreciation for LCSW assistance. Patient reports his family is very supportive in this time and has brought him clothes to take with him to the facility. Patient reports he looks forward to doing better for himself and putting himself first for once. Brief supportive counseling was provided to the patient. No other needs to report. Additional resources provided in AVS. LCSW to sign off at this time. Please inform if further LCSW needs arise prior to discharge.   Fernande Boyden, LCSW Clinical Social Worker [place] [facility] Ph: [phone]

## 2023-07-19 NOTE — Discharge Summary (Signed)
[name] to be D/C'd  [facility]  per MD order. Discussed with the patient and all questions fully answered. An After Visit Summary was printed and given to the patient. Medication scripts were also given to patient. All belongings returned. Patient escorted out and D/C home via private auto.  Dickie La  07/19/2023 11:24 AM
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
## 2023-07-19 NOTE — ED Notes (Signed)
Patient is sleeping. Respirations equal and unlabored, skin warm and dry. No change in assessment or acuity. Routine safety checks conducted according to facility protocol. Will continue to monitor for safety.   

## 2023-09-13 ENCOUNTER — Ambulatory Visit (HOSPITAL_COMMUNITY): Payer: BLUE CROSS/BLUE SHIELD | Admitting: Student
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
